# Patient Record
Sex: Female | Born: 1980
Health system: Southern US, Community
[De-identification: ages and names within clinical notes are randomized; demographics above are authoritative.]

## PROBLEM LIST (undated history)

## (undated) DIAGNOSIS — L309 Dermatitis, unspecified: Secondary | ICD-10-CM

## (undated) DIAGNOSIS — O24419 Gestational diabetes mellitus in pregnancy, unspecified control: Secondary | ICD-10-CM

## (undated) DIAGNOSIS — R809 Proteinuria, unspecified: Secondary | ICD-10-CM

## (undated) DIAGNOSIS — I10 Essential (primary) hypertension: Secondary | ICD-10-CM

## (undated) DIAGNOSIS — Z8632 Personal history of gestational diabetes: Secondary | ICD-10-CM

## (undated) HISTORY — DX: Dermatitis, unspecified: L30.9

## (undated) HISTORY — DX: Gestational diabetes mellitus in pregnancy, unspecified control: O24.419

## (undated) HISTORY — DX: Essential (primary) hypertension: I10

## (undated) HISTORY — PX: NO PAST SURGERIES: SHX2092

## (undated) HISTORY — DX: Proteinuria, unspecified: R80.9

## (undated) HISTORY — DX: Personal history of gestational diabetes: Z86.32

---

## 2019-09-12 NOTE — L&D Delivery Note (Addendum)
Delivery Note At 1:51 AM, on April 18, 2020, a viable female "Quran" was delivered via Vaginal, Spontaneous (Presentation: Right Occiput Anterior) by Bradley Ferris, SNM.  APGAR: 7, 10; weight 8 lb (3629 g).   Placenta status: Spontaneous, Intact.  Cord: 3 vessels with the following complications: None.   Anesthesia: Epidural Episiotomy: None Lacerations: None Suture Repair: None Est. Blood Loss (mL): 60  Mom to postpartum.  Baby to Couplet care / Skin to Skin.  Cherre Robins, MSN, CNM

## 2019-10-14 ENCOUNTER — Ambulatory Visit (INDEPENDENT_AMBULATORY_CARE_PROVIDER_SITE_OTHER): Payer: Self-pay | Admitting: *Deleted

## 2019-10-14 ENCOUNTER — Encounter: Payer: Self-pay | Admitting: *Deleted

## 2019-10-14 ENCOUNTER — Other Ambulatory Visit: Payer: Self-pay

## 2019-10-14 DIAGNOSIS — O09529 Supervision of elderly multigravida, unspecified trimester: Secondary | ICD-10-CM

## 2019-10-14 DIAGNOSIS — O0990 Supervision of high risk pregnancy, unspecified, unspecified trimester: Secondary | ICD-10-CM

## 2019-10-14 DIAGNOSIS — Z8632 Personal history of gestational diabetes: Secondary | ICD-10-CM

## 2019-10-14 DIAGNOSIS — R809 Proteinuria, unspecified: Secondary | ICD-10-CM | POA: Insufficient documentation

## 2019-10-14 DIAGNOSIS — Z8759 Personal history of other complications of pregnancy, childbirth and the puerperium: Secondary | ICD-10-CM

## 2019-10-14 NOTE — Progress Notes (Signed)
Patient seen and assessed by nursing staff during this encounter. I have reviewed the chart and agree with the documentation and plan.  Jaynie Collins, MD 10/14/2019 11:09 AM

## 2019-10-14 NOTE — Progress Notes (Signed)
I connected with  Wendy Harris on 10/14/19 at 10:30 AM EST by telephone and verified that I am speaking with the correct person using two identifiers.   I discussed the limitations, risks, security and privacy concerns of performing an evaluation and management service by telephone and the availability of in person appointments. I also discussed with the patient that there may be a patient responsible charge related to this service. The patient expressed understanding and agreed to proceed.   Explained I am completing her New OB Intake today. We discussed Her EDD and that it is based on  Early [place]. I reviewed her allergies, meds, OB History, Medical /Surgical history, and appropriate screenings.I informed her of [facility] services.  I explained I will send her the Bab[employer] app- app sent to her while on phone and she will download after call.  I explained we will send a blood pressure cuff to [facility] once her medicaid is active in [place]- she has applied to change from [place] to [place]. Explained  then we will have her take her blood pressure weekly and enter into the app. Explained she will have some visits in office and some virtually. I sent her a text to sign up for  MyChart and she will complete after the call. I reviewed her new ob  appointment date/ time with her , our location and to wear mask, no visitors.  I explained she will have a pelvic exam, ob bloodwork, hemoglobin a1C, cbg , genetic testing if desired,- she does want a panorama,  pap if needed. I scheduled an Korea at 19 weeks and gave her the appointment. I also offered her genetic counseling due to her age and she accepted. I gave her that appointment also.  She voices understanding.   Lisa-Marie Rueger,RN 10/14/2019  10:18 AM

## 2019-10-14 NOTE — Patient Instructions (Signed)

## 2019-10-23 ENCOUNTER — Other Ambulatory Visit: Payer: Self-pay

## 2019-10-23 ENCOUNTER — Ambulatory Visit (INDEPENDENT_AMBULATORY_CARE_PROVIDER_SITE_OTHER): Payer: Self-pay | Admitting: Obstetrics & Gynecology

## 2019-10-23 VITALS — BP 114/77 | HR 97 | Wt 217.0 lb

## 2019-10-23 DIAGNOSIS — O0990 Supervision of high risk pregnancy, unspecified, unspecified trimester: Secondary | ICD-10-CM

## 2019-10-23 DIAGNOSIS — Z113 Encounter for screening for infections with a predominantly sexual mode of transmission: Secondary | ICD-10-CM

## 2019-10-23 LAB — POCT URINALYSIS DIP (DEVICE)
Bilirubin Urine: NEGATIVE
Glucose, UA: NEGATIVE mg/dL
Hgb urine dipstick: NEGATIVE
Ketones, ur: NEGATIVE mg/dL
Leukocytes,Ua: NEGATIVE
Nitrite: NEGATIVE
Protein, ur: NEGATIVE mg/dL
Specific Gravity, Urine: >=1.03 (ref 1.005–1.030)
Urobilinogen, UA: 0.2 mg/dL (ref 0.0–1.0)
pH: 6.5 (ref 5.0–8.0)

## 2019-10-23 NOTE — Progress Notes (Signed)
Went to room to see patient but discovered that she left after the RN intake. She will be called and a new appointment will be offered to her.   Jaynie Collins, MD, FACOG Obstetrician & Gynecologist, [facility] for [employer], [facility]

## 2019-10-23 NOTE — Patient Instructions (Signed)
AREA PEDIATRIC/FAMILY PRACTICE PHYSICIANS  [place] ([address]) . [facility] o Chambliss, MD; Eniola, MD; Hale, MD; Hensel, MD; McDiarmid, MD; McIntyer, MD; Neal, MD; Walden, MD o [address] [place], [place] [address] o [phone] o Mon-Fri 8:30-12:30, 1:30-5:00 o Providers come to see babies at [facility] o Accepting Medicaid . [facility] o Limited providers who accept newborns: Koirala, MD; Morrow, MD; Wolters, MD o [address], [place], [place] [address] o [phone] o Mon-Fri 8:00-5:30 o Babies seen by providers at [facility] o Does NOT accept Medicaid o Please call early in hospitalization for appointment (limited availability)  . [facility] o Mulberry, MD o [address] [place], [place] [address] o [phone] o Mon, Tue, Thur, Fri 8:30-5:00, Wed 10:00-7:00 (closed 1-2pm) o Babies seen by [facility] providers o Accepting Medicaid . [facility] o Rubin, MD o [address], [place], [place] [address] o [phone] o Mon-Fri 8:30-5:00, Sat 8:30-12:00 o Provider comes to see babies at [facility] o Accepting Medicaid o Must have been referred from current patients or contacted office prior to delivery . [facility] ([facility]) o Brown, MD; Chandler, MD; Ettefagh, MD; Grant, MD; Lester, MD; McCormick, MD; McQueen, MD; Prose, MD; Simha, MD; Stanley, MD; Stryffeler, NP; Tebben, NP o [address], [place], [place] [address] o [phone] o Mon, Tue, Thur, Fri 8:30-5:30, Wed 9:30-5:30, Sat 8:30-12:30 o Babies seen by [facility] providers o Accepting Medicaid o Only accepting infants of first-time parents or siblings of current patients o Hospital discharge coordinator will make follow-up appointment . Jack Amos o [address],  [place], [place]  [address] o [phone]   Fax - [phone] . [facility] o [address], [place], [place]  [address] o Phone - [phone]   Fax - [phone] . Shilpa Gosrani o [address], [place], [place]  [address] o [phone]  [place] ([address]) . [facility] o Bates, MD; [place], MD; Cooper, Cox, MD; MD; Davis, MD; Dovico, MD; Ettefaugh, MD; Little, MD; Lowe, MD; Keiffer, MD; Melvin, MD; Sumner, MD; Williams, MD o [address], [place], [place] [address] o [phone] o Mon-Fri 8:30-5:00 (extended evenings Mon-Thur as needed), Sat-Sun 10:00-1:00 o Providers come to see babies at [facility] o Accepting Medicaid for families of first-time babies and families with all children in the household age 3 and under. Must register with office prior to making appointment (M-F only). . [facility] o Henson, NP; Knapp, MD; Lalonde, MD; Tysinger, PA o [address] [place],  [address] o [phone] o Mon-Fri 8:00-5:00 o Babies seen by providers at [facility] o Does NOT accept Medicaid/Commercial Insurance Only . [facility] ([facility]  o Artis, MD; Barnes, MD; Bratton, MD; Coccaro, MD; Lockett Gardner, MD; Kramer, MD; Marshall, MD; Netherton, MD; Poleto, MD; Skinner, MD o [address] [place],  [address] o [phone] o Mon-Fri 8:30-5:30, Sat (Oct.-Mar.) 9:00-1:00 o Babies seen by providers at [facility] o Accepting Medicaid  [place] ([address]) . [facility] o Reid, MD; Warner, MD o [address], [place],  [address] o [phone] o Mon-Fri 8:30-5:00, Sat 8:30-12:00 o Providers come to see babies at [facility] o Does NOT accept Medicaid . [facility] o Becker, PA; Hagler, MD; Scifres, PA; Sun, MD; Swayne, MD o [address],  [place], [place] [address] o [phone] o Mon-Fri 8:00-5:00 o Babies seen by providers at [facility] o Does NOT accept Medicaid o Only accepting babies of parents who are patients o Please call early in hospitalization for appointment (limited availability) . [facility] o Clark, MD; Frye, MD; Kelleher, MD; Mack, NP; Miller, MD; O'Keller, MD; Patterson, NP; Pudlo, MD; Puzio, MD; Thomas, MD; Tucker, MD; Twiselton, MD o [address], [place], [place] [address] o [phone] o Mon-Fri 8:00-5:00, Sat 9:00-12:00 o Providers come to see babies at [facility] o Does NOT accept Medicaid  [place] ([address]) . [facility] o Limited providers accepting new patients: Brake, NP; Wharton, PA o [address], [place], [place] [address] o [phone] o Mon-Fri 8:00-5:00 o Babies seen by providers at [facility] o Does NOT accept Medicaid o Only accepting babies of parents who are patients o Please call early in hospitalization for appointment (limited availability) . [facility] o Gay, MD; Quinlan, MD o [address] [place], [place] [address] o [phone] (press 1 to schedule appointment) o Mon-Fri 8:00-5:00 o Providers come to see babies at [facility] o Does NOT accept Medicaid . [facility] o Mazer, MD o [address] [place], [place] [address] o [phone] o Mon-Fri 8:30-5:00 (lunch 12:30-1:00), extended hours by appointment only Wed 5:00-6:30 o Babies seen by [facility] providers o Accepting Medicaid . [facility] o Banks, MD; Jordan, MD; Koberlein, MD o [address], [place], [place] [address] o [phone] o Mon-Fri 8:00-5:00 o Babies seen by [facility] providers o Does NOT accept Medicaid . [facility] o Parker, MD; Hunter, MD; Wallace, DO o [address] [place], [place]  [address] o [phone] o Mon-Fri 8:00-5:00 o Babies seen by [facility] providers o Does NOT accept Medicaid . [facility] o Brandon, PA; Brecken, PA; Christy, NP; Dees, MD; DeClaire, MD; DeWeese, MD; Hansen, NP; Mills, NP; Parrish, NP; Smoot, NP; Summer, MD; Vapne, MD o [address] [place], [place] [address] o [phone] o Mon-Fri 8:30-5:00, Sat 10:00-1:00 o Providers come to see babies at [facility] o Does NOT accept Medicaid o Free prenatal information session Tuesdays at 4:45pm . [facility] o Bouska, MD; Gordon, PA; Jeffery, PA; Weber, PA o [address] [place] [address] o [phone] o Mon-Fri 7:30-5:30 o Babies seen by [facility] providers . [facility] o [address], [place], [place]  [address] o [phone]   Fax - [phone]  [place] ([address] & [address]) . [facility] o Reese, MD o [address] [place], [place] [address] o [phone] o Mon-Thur 8:00-6:00 o Providers come to see babies at [facility] o Accepting Medicaid . [facility] o Anderson, NP; Badger, MD; Beal, PA; Spencer, PA o [address] [place], [place] [address] o [phone] o Mon-Thur 7:30-7:30, Fri 7:30-4:30 o Babies seen by [facility] providers o Accepting Medicaid . [facility] o Agbuya, MD; Klett, NP; Romgoolam, MD o [address], [place], [place] [address] o [phone] o Mon-Fri 8:30-5:00, Sat 8:30-12:00 o Providers come to see babies at [facility] o Accepting Medicaid o Must have "Meet & Greet" appointment at office prior to delivery . [facility] ([facility] o McCord,   MD; Wallace, MD; Wood, MD o [address], [place], [place] [address] o [phone] o Mon-Wed 8:00-6:00, Thur-Fri 8:00-5:00, Sat 9:00-12:00 o Providers come to  see babies at [facility] o Does NOT accept Medicaid o Only accepting siblings of current patients . [facility]  o [address], [place], [place]  [address] o [phone]   Fax - [phone] . [facility] o [address], [place], [place]  [address] o [phone]   Fax - [phone]  [place] ([address] & [address]) . [facility] o Cirigliano, DO; Matthews, DO o [address] [place], [place] [address] o [phone] o Mon-Fri 7:00-5:00 o Babies seen by [facility] providers o Does NOT accept Medicaid . [facility] o Briscoe, MD; Howley, PA; Moreira, PA o [address], [place], [place] [address] o [phone] o Mon-Fri 8:00-5:00 o Babies seen by [facility] providers o Accepting Medicaid . [facility] o Boyd, MD; Church, PA; Jones, NP; Osborn, PA o [address], [place], [place] [address] o [phone] o Mon-Fri 8:00-5:00 o Babies seen by providers at [facility] o Accepting Medicaid  [place] ([address]) . [facility] [place] o Wendling, DO o [address] [place], [place] [address] o [phone] o Mon-Fri 8:00-5:00 o Babies seen by [facility] providers o Does NOT accept Medicaid o Limited availability, please call early in hospitalization to schedule follow-up . [facility] o Calderon, PA; Cummings, MD; [place]illard, MD; Luck, PA; Olson, MD; VanDeven, PA o [address], [place], [place] [address] o [phone] o Mon-Fri 8:30-5:00, Sat 9:00-12:00 o Babies seen by providers at [facility] o Accepting Medicaid o Please register online then schedule online or call office o [email or web address] . [facility] ([facility] o Hunter, NP; Kumar, MD; Mraz Rogers, PA o [address], [place], [place] [address] o [phone] o Mon-Fri 8:00-5:00 o Babies seen by providers at [facility] o Accepting Medicaid . [facility] ([facility] o Wilcox, MD; Kristi Fleenor, NP; West, MD o [address], [place], [place] [address] o [phone] o Mon-Fri 8:00-5:30, Sat&Sun by appointment (phones open at 8:30) o Babies seen by [facility] providers o Accepting Medicaid o Must be a first-time baby or sibling of current patient . [facility] [place]  o [address], [place], [place]  [address] o [phone]   Fax - [phone]  [place] ([address]) . [facility] o Brown, PA; Cowen, PA; Rice, MD; Helton, PA; Spry, MD o [address] [place], [place] [address] o [phone] o Mon-Thur 8:00-7:00, Fri 8:00-5:00, Sat 8:00-12:00, Sun 9:00-12:00 o Babies seen by [facility] providers o Accepting Medicaid . [facility] [place] o Coe-Goins, MD; Marshall, MD; Pierre-Louis, MD o [address], [place], [place] [address] o [phone] o Mon-Thur 8:00-5:00 o Babies seen by providers at [facility] o Accepting Medicaid . [facility] o Bratton, MD; Coe-Goins, MD; Hayes, MD; Lewis, MD; List, MD; Lott, MD; Marshall, MD; Moran, MD; O'Neal, MD; Pierre-Louis, MD; Pitonzo, MD; Scholer, MD; Spangle, MD o [address] [place], [place]   [address] o [phone] o Mon-Fri 8:00-5:30, Sat (Oct.-Mar.) 9:00-1:00 o Babies seen by providers at [facility] o Accepting Medicaid o Must fill out new patient packet, available online at [email or web address] . [facility] ([facility] o Friddle, NP; Harris, NP; Kelly, NP; Logan, MD;  [place]vin, PA; Poth, MD; Ramadoss, MD; Stanton, NP o [address], [place], [place] [address] o [phone] o Mon-Thur 8:00-5:30, Fri 8:00-5:00 o Babies seen by providers at [facility] o Accepting Medicaid  [place] ([address]) . [facility] o Dixon, PA; [place], MD; Pickard, MD; Tapia, PA o [address], [place], [place] [address] o [phone] o Mon-Fri 8:00-5:00 o Babies seen by providers at [facility] o Accepting Medicaid   [place] ([address]) . [facility] [place] o Masneri, DO; Meyers, MD; Nelson, PA o [address], [place], [place] [address] o [phone] o Mon-Fri 8:00-5:00 o Babies seen by providers at [facility] o Does NOT accept Medicaid o Limited appointment availability, please call early in hospitalization  . [facility] [place] o Kunedd, DO; McGowen, MD o [address], [place], [place] [address] o [phone] o Mon-Fri 8:00-5:00 o Babies seen by [facility] providers o Does NOT accept Medicaid . [facility] [place] o Cameron, MD; MacDonald, MD; Michaels, PA; Nayak, MD o [address] [place] [address], [place], [place] [address] o [phone] o Mon-Fri 8:00-5:00 o After hours clinic ([address] [place], [place] [address]) [phone] Mon-Fri 5:00-8:00, Sat 12:00-6:00, Sun 10:00-4:00 o Babies seen by [facility] providers o Accepting Medicaid . [facility] [place] o [address], [place], [place]  [address] o [phone]   Fax - [phone]  [place] ([address]) . [facility] [place] o Andy, MD o [address], [place], [place] [address] o [phone] o Mon-Fri 8:00-5:00 o Babies seen by [facility] providers o Does NOT accept Medicaid . [facility] [place] ([facility] [place]) o Eksir, MD o [address], [place], [place]  [address] o [phone] o Mon-Thur 8:00-7:00, Fri 8:00-5:00, Sat 8:00-12:00 o Babies seen by providers at [facility] o Accepting Medicaid - but does not have vaccinations in office (must be received elsewhere) o Limited availability, please call early in hospitalization  [place] ([address]) . [facility]  o Charlene Flemming, MD o [address], [place] [address] o [phone]  Fax [phone]   

## 2019-10-23 NOTE — Addendum Note (Signed)
Addended by: Kathee Delton on: 10/23/2019 12:10 PM   Modules accepted: Orders

## 2019-10-24 LAB — PROTEIN / CREATININE RATIO, URINE
Creatinine, Urine: 184.4 mg/dL
Protein, Ur: 11.3 mg/dL
Protein/Creat Ratio: 61 mg/g creat (ref 0–200)

## 2019-10-24 LAB — GC/CHLAMYDIA PROBE AMP (~~LOC~~) NOT AT ARMC
Chlamydia: NEGATIVE
Comment: NEGATIVE
Comment: NORMAL
Neisseria Gonorrhea: NEGATIVE

## 2019-10-25 LAB — CULTURE, OB URINE

## 2019-10-25 LAB — URINE CULTURE, OB REFLEX

## 2019-12-15 ENCOUNTER — Ambulatory Visit (HOSPITAL_COMMUNITY): Payer: Self-pay

## 2019-12-15 ENCOUNTER — Encounter (HOSPITAL_COMMUNITY): Payer: Self-pay

## 2019-12-16 ENCOUNTER — Ambulatory Visit (HOSPITAL_COMMUNITY): Payer: Self-pay | Admitting: Obstetrics & Gynecology

## 2019-12-16 ENCOUNTER — Other Ambulatory Visit (HOSPITAL_COMMUNITY): Payer: Self-pay | Admitting: *Deleted

## 2019-12-16 ENCOUNTER — Other Ambulatory Visit: Payer: Self-pay

## 2019-12-16 ENCOUNTER — Ambulatory Visit (HOSPITAL_COMMUNITY): Payer: Medicaid Other | Admitting: *Deleted

## 2019-12-16 ENCOUNTER — Ambulatory Visit (HOSPITAL_BASED_OUTPATIENT_CLINIC_OR_DEPARTMENT_OTHER): Payer: Medicaid Other | Admitting: Genetic Counselor

## 2019-12-16 ENCOUNTER — Encounter (HOSPITAL_COMMUNITY): Payer: Self-pay | Admitting: *Deleted

## 2019-12-16 ENCOUNTER — Ambulatory Visit (HOSPITAL_COMMUNITY)
Admission: RE | Admit: 2019-12-16 | Discharge: 2019-12-16 | Disposition: A | Discharge status: Home / Self Care | Payer: Medicaid Other | Source: Ambulatory Visit | Attending: Obstetrics & Gynecology | Admitting: Obstetrics & Gynecology

## 2019-12-16 DIAGNOSIS — O09292 Supervision of pregnancy with other poor reproductive or obstetric history, second trimester: Secondary | ICD-10-CM

## 2019-12-16 DIAGNOSIS — O0990 Supervision of high risk pregnancy, unspecified, unspecified trimester: Secondary | ICD-10-CM

## 2019-12-16 DIAGNOSIS — Z315 Encounter for genetic counseling: Secondary | ICD-10-CM

## 2019-12-16 DIAGNOSIS — Z363 Encounter for antenatal screening for malformations: Secondary | ICD-10-CM

## 2019-12-16 DIAGNOSIS — Z8759 Personal history of other complications of pregnancy, childbirth and the puerperium: Secondary | ICD-10-CM

## 2019-12-16 DIAGNOSIS — O09529 Supervision of elderly multigravida, unspecified trimester: Secondary | ICD-10-CM | POA: Insufficient documentation

## 2019-12-16 DIAGNOSIS — Z3A19 19 weeks gestation of pregnancy: Secondary | ICD-10-CM

## 2019-12-16 DIAGNOSIS — Z8632 Personal history of gestational diabetes: Secondary | ICD-10-CM | POA: Insufficient documentation

## 2019-12-16 DIAGNOSIS — O09522 Supervision of elderly multigravida, second trimester: Secondary | ICD-10-CM

## 2019-12-16 DIAGNOSIS — Z362 Encounter for other antenatal screening follow-up: Secondary | ICD-10-CM

## 2019-12-16 NOTE — Progress Notes (Signed)
12/16/2019  Wendy Harris 1981-07-10 MRN: [id number] DOV: 12/16/2019  Wendy Harris presented to the [facility] for a genetics consultation regarding advanced maternal age. Wendy Harris came to her appointment alone due to COVID-19 visitor restrictions.   Indication for genetic counseling - Advanced maternal age  Prenatal history  Wendy Harris is a [id number], 39 y.o. female. Her current pregnancy has completed [redacted]w[redacted]d (Estimated Date of Delivery: 05/08/20).  Ms. Shilling denied exposure to environmental toxins or chemical agents. She denied the use of alcohol, tobacco or street drugs. She denied significant viral illnesses, fevers, and bleeding during the course of her pregnancy. She has a history of atopic dermatitis in the current pregnancy. Her medical history was otherwise noncontributory.  Family History  A three generation pedigree was drafted and reviewed. The family history is remarkable for the following:  - Wendy Harris has a sister was born with a heart murmur. The etiology of this heart murmur is unknown and records are not available. Thus, precise risk assessment is limited.  - Wendy Harris has a paternal half sister who was born with a cleft palate. This individual's daughter was also born with a cleft palate. Cleft palate is most often an isolated condition, but can be present in combination with other birth defects possibly as part of a genetic syndrome. Per Wendy Harris, these individuals do not have any other medical or developmental problems; thus, their cleft palate is likely isolated. Cleft palate occurs in 0.04% of the general population. If the cleft palate in the family is isolated, the risk of recurrence for a third degree relative such as the current fetus is ~0.5% Ladene Artist et al., 2009). Detailed anatomy ultrasounds are often able to detect cleft palate prenatally.  - Wendy Harris's partner, Wendy Harris, has a son from a prior relationship who has motor and speech  delays. Mr. Shon Baton also has a sister and a maternal half brother who have a learning disability. We discussed that many times, learning disabilities and developmental delays are multifactorial in nature, occurring due to a combination of genetic and environmental factors that are difficult to identify. Learning disabilities and developmental delays can appear to run in families; thus, there is a chance that the couple's children could also experience learning disabilities or developmental delays of some kind. Wendy Harris understands that she should make the pediatrician aware of any concerns she has about her children's development.  The remaining family histories were reviewed and found to be noncontributory for birth defects, intellectual disability, recurrent pregnancy loss, and known genetic conditions.    The patient's ethnicity is [place]. The father of the pregnancy's ethnicity is African American. Consanguinity was denied. Wendy Harris was unsure of whether or not she or her partner have any Ashkenazi Jewish ancestry. Pedigree will be scanned under Media.  Discussion  WendyMartinwas referredtogenetic counseling for advanced maternal age, as she will be25years old at the time of delivery. We discussed that as a woman ages, the chance for certain chromosomal conditions, such astrisomy 57 (Down syndrome), trisomy 10, and trisomy 23 in a fetus increases. These conditions often are not inherited, but insteadoccur due toan error in chromosomal division during the formation of sperm and eggcells in a process called nondisjunction.Nondisjunction occurs more frequently as a woman ages.AtMs. Harris'sage and during thesecondtrimester,she has approximately a 1 in 56 (2%) chance of having a child with a chromosomalaneuploidy. Her age-related risk to have a child with Down syndrome specifically is 1 in99(1%)in thesecondtrimester. Webriefly reviewed features  associated with Down syndrome, trisomy  88, and trisomy 63.  We reviewed noninvasive prenatal screening (NIPS) as an available screening option. Specifically, we discussed that NIPS analyzes cell free DNA originating from the placenta that is found in the maternal blood circulation during pregnancy. This test is not diagnostic for chromosome conditions, but can provide information regarding the presence or absence of extra fetal DNA for chromosomes 13, 18, 21, and the sex chromosomes. Thus, it would not identify or rule out all fetal aneuploidy. The reported detection rate is 91-99% for trisomies 21, 18, 13, and sex chromosome aneuploidies. The false positive rate is reported to be less than 0.1% for any of these conditions. Wendy Harris indicated that she is not interested in undergoing NIPS. She indicated that her pregnancy management would remain the same if the current fetus were suspected to have a chromosomal aneuploidy, so she felt comfortable proceeding with standard ultrasounds only.   Per ACOG recommendation, carrier screening for hemoglobinopathies, cystic fibrosis (CF) and spinal muscular atrophy (SMA) was discussed including information about the conditions, rationale for testing, autosomal recessive inheritance, and the option of prenatal diagnosis. The patient was informed that select hemoglobinopathies and CF are included on Wendy Harris's newborn screen. SMA will be added to Wendy Harris's newborn screen in April 2021. [name] declined all carrier screening today. Without carrier screening to refine risk and based on ethnicity alone, [name] risk to be a carrier of CF is 1 in 15. Her risk to be a carrier of SMA is 1 in 68. Her risk to be a carrier of HBB-related hemoglobinopathies is 1 in 8.   A complete ultrasound was performed today prior to our visit. The ultrasound report will be sent under separate cover. There were no visualized fetal anomalies or markers suggestive of aneuploidy. [name] was counseled that  approximately 50% of babies with Down syndrome and 90% of babies with trisomies 48 or 18 would demonstrate some sign of the condition on this ultrasound.  Ms. Thatch was also counseled regarding diagnostic testing via amniocentesis. We discussed the technical aspects of the procedure and quoted up to a 1 in 500 (0.2%) risk for spontaneous pregnancy loss or other adverse pregnancy outcomes as a result of amniocentesis. Cultured cells from an amniocentesis sample allow for the visualization of a fetal karyotype, which can detect >99% of chromosomal aberrations. Chromosomal microarray can also be performed to identify smaller deletions or duplications of fetal chromosomal material. After careful consideration, Ms. Lovecchio declined amniocentesis at this time. She understands that amniocentesis is available at any point after 16 weeks of pregnancy and that she may opt to undergo the procedure at a later date should she change her mind.  Lastly, screening for open neural tube defects (ONTDs) via MS-AFP in the second trimester in addition to level II ultrasound examination is recommended. Ms. Rouillard level II ultrasound did not detect any ONTDs. Level II ultrasound is able to detect ONTDs with 90-95% sensitivity. However, normal results from level II ultrasound and MS-AFP screening do not guarantee a normal baby, as 3-5% of newborns have some type of birth defect, many of which are not prenatally diagnosable.  Additional screening and diagnostic testing were declined today. She understands that screening tests, including ultrasound, cannot rule out all birth defects or genetic syndromes. The patient was advised of this limitation and states she still does not want additional testing or screening at this time.   I counseled Ms. Sherrin regarding the above risks and available  options. The approximate face-to-face time with the genetic counselor was 25 minutes.  In summary:  Discussed advanced maternal age and  options for follow-up testing  ~2% chance for chromosomal aneuploidy based on age at delivery  Declined noninvasive prenatal screening (NIPS) for chromosomal aneuploidies  Reviewed results of ultrasound  No fetal anomalies or markers seen  Reduction in risk for fetal aneuploidy  Discussed carrier screening for cystic fibrosis, spinal muscular atrophy, and hemoglobinopathies  Declined carrier screening  Offered additional testing and screening  Declined amniocentesis  Recommend MS-AFP screening  Reviewed family history concerns   Buelah Manis, MS, Counselling psychologist

## 2019-12-19 ENCOUNTER — Telehealth: Payer: Self-pay | Admitting: Physician Assistant

## 2019-12-19 DIAGNOSIS — L309 Dermatitis, unspecified: Secondary | ICD-10-CM

## 2019-12-19 MED ORDER — TRIAMCINOLONE ACETONIDE 0.1 % EX CREA: 1.0000 "application " | TOPICAL_CREAM | Freq: Two times a day (BID) | CUTANEOUS | 0 refills | Status: DC

## 2019-12-19 NOTE — Progress Notes (Signed)
E Visit for Rash  We are sorry that you are not feeling well. Here is how we plan to help!  If your condition fails to improve with the following plan, please make an appointment with your primary care provider.  Please be sure to have a plan in place agreed upon by you and your PCP for how to treat your next flare, as this is a chronic condition.   If you do not have a PCP, [facility] offers a free physician referral service available at [phone]. Our trained staff has the experience, knowledge and resources to put you in touch with a physician who is right for you.    I have prescribed Kenalog ointment. Apply this twice daily for the next 2 weeks. (If you have an infant/young children, wear gloves after application to prevent exposing your child to this medication.)  HOME CARE:  Avoid excessive hand washing.  Wash your hands with luke warm water with mild un-scented soap. Gently pat your hands dry, especially between fingers.  Hand washing and drying should be followed immediately by the application of a generous amount of heavy hand cream or ointment, such as petroleum jelly.   Avoid triggers: chemical exposure, excessive wetness.   GET HELP RIGHT AWAY IF:   Symptoms don't go away after treatment.  Severe itching that persists.  If you rash spreads or swells.  If you rash begins to smell.  If it blisters and opens or develops a yellow-brown crust.  You develop a fever.  You have a sore throat.  You become short of breath.  MAKE SURE YOU:  Understand these instructions. Will watch your condition. Will get help right away if you are not doing well or get worse.  Thank you for choosing an e-visit. Your e-visit answers were reviewed by a board certified advanced clinical practitioner to complete your personal care plan. Depending upon the condition, your plan could have included both over the counter or prescription medications. Please review your pharmacy choice. Be  sure that the pharmacy you have chosen is open so that you can pick up your prescription now.  If there is a problem you may message your provider in MyChart to have the prescription routed to another pharmacy. Your safety is important to [place]. If you have drug allergies check your prescription carefully.  For the next 24 hours, you can use MyChart to ask questions about today's visit, request a non-urgent call back, or ask for a work or school excuse from your e-visit provider. You will get an email in the next two days asking about your experience. I hope that your e-visit has been valuable and will speed your recovery.     Greater than 5 minutes, yet less than 10 minutes of time have been spent researching, coordinating and implementing care for this patient today.

## 2019-12-23 NOTE — Progress Notes (Signed)
Erroneous encounter

## 2020-01-13 ENCOUNTER — Ambulatory Visit
Admission: RE | Admit: 2020-01-13 | Discharge: 2020-01-13 | Discharge status: Absent without leave | Payer: Self-pay | Source: Ambulatory Visit

## 2020-01-13 ENCOUNTER — Other Ambulatory Visit (INDEPENDENT_AMBULATORY_CARE_PROVIDER_SITE_OTHER): Payer: Self-pay | Admitting: Physician Assistant

## 2020-01-15 ENCOUNTER — Other Ambulatory Visit: Payer: Self-pay

## 2020-01-15 ENCOUNTER — Encounter: Payer: Self-pay | Admitting: *Deleted

## 2020-01-15 ENCOUNTER — Ambulatory Visit (HOSPITAL_COMMUNITY): Payer: Medicaid Other | Attending: Obstetrics and Gynecology

## 2020-01-15 ENCOUNTER — Ambulatory Visit: Payer: Medicaid Other | Admitting: *Deleted

## 2020-01-15 DIAGNOSIS — O0990 Supervision of high risk pregnancy, unspecified, unspecified trimester: Secondary | ICD-10-CM

## 2020-01-15 DIAGNOSIS — Z8759 Personal history of other complications of pregnancy, childbirth and the puerperium: Secondary | ICD-10-CM | POA: Diagnosis present

## 2020-01-15 DIAGNOSIS — O09529 Supervision of elderly multigravida, unspecified trimester: Secondary | ICD-10-CM | POA: Insufficient documentation

## 2020-01-15 DIAGNOSIS — O09522 Supervision of elderly multigravida, second trimester: Secondary | ICD-10-CM

## 2020-01-15 DIAGNOSIS — Z362 Encounter for other antenatal screening follow-up: Secondary | ICD-10-CM | POA: Diagnosis present

## 2020-01-15 DIAGNOSIS — O09292 Supervision of pregnancy with other poor reproductive or obstetric history, second trimester: Secondary | ICD-10-CM

## 2020-01-15 DIAGNOSIS — Z3A23 23 weeks gestation of pregnancy: Secondary | ICD-10-CM

## 2020-01-27 ENCOUNTER — Other Ambulatory Visit: Payer: Self-pay

## 2020-01-27 ENCOUNTER — Encounter: Payer: Self-pay | Admitting: Obstetrics and Gynecology

## 2020-01-27 ENCOUNTER — Ambulatory Visit (INDEPENDENT_AMBULATORY_CARE_PROVIDER_SITE_OTHER): Payer: Medicaid Other | Admitting: Obstetrics and Gynecology

## 2020-01-27 VITALS — BP 137/69 | HR 100 | Wt 248.2 lb

## 2020-01-27 DIAGNOSIS — Z8632 Personal history of gestational diabetes: Secondary | ICD-10-CM

## 2020-01-27 DIAGNOSIS — Z8759 Personal history of other complications of pregnancy, childbirth and the puerperium: Secondary | ICD-10-CM

## 2020-01-27 DIAGNOSIS — O0990 Supervision of high risk pregnancy, unspecified, unspecified trimester: Secondary | ICD-10-CM

## 2020-01-27 DIAGNOSIS — O09522 Supervision of elderly multigravida, second trimester: Secondary | ICD-10-CM

## 2020-01-27 DIAGNOSIS — O2603 Excessive weight gain in pregnancy, third trimester: Secondary | ICD-10-CM

## 2020-01-27 DIAGNOSIS — O09292 Supervision of pregnancy with other poor reproductive or obstetric history, second trimester: Secondary | ICD-10-CM

## 2020-01-27 DIAGNOSIS — O2600 Excessive weight gain in pregnancy, unspecified trimester: Secondary | ICD-10-CM | POA: Insufficient documentation

## 2020-01-27 DIAGNOSIS — Z3A25 25 weeks gestation of pregnancy: Secondary | ICD-10-CM

## 2020-01-27 DIAGNOSIS — O09529 Supervision of elderly multigravida, unspecified trimester: Secondary | ICD-10-CM

## 2020-01-27 DIAGNOSIS — O2602 Excessive weight gain in pregnancy, second trimester: Secondary | ICD-10-CM

## 2020-01-27 NOTE — Progress Notes (Signed)
   PRENATAL VISIT NOTE  Subjective:  Wendy Harris is a 39 y.o. [id number] at [redacted]w[redacted]d being seen today for ongoing prenatal care.  She is currently monitored for the following issues for this high-risk pregnancy and has History of gestational diabetes mellitus (GDM); History of gestational hypertension; Supervision of high risk pregnancy, antepartum; AMA (advanced maternal age) multigravida 35+; Proteinuria; and Excessive weight gain during pregnancy on their problem list.  Patient reports no complaints.  Contractions: Not present. Vag. Bleeding: None.  Movement: Present. Denies leaking of fluid.   The following portions of the patient's history were reviewed and updated as appropriate: allergies, current medications, past family history, past medical history, past social history, past surgical history and problem list.   Objective:   Vitals:   01/27/20 0908  BP: 137/69  Pulse: 100  Weight: 248 lb 3.2 oz (112.6 kg)    Fetal Status: Fetal Heart Rate (bpm): 145   Movement: Present     General:  Alert, oriented and cooperative. Patient is in no acute distress.  Skin: Skin is warm and dry. No rash noted.   Cardiovascular: Normal heart rate noted  Respiratory: Normal respiratory effort, no problems with respiration noted  Abdomen: Soft, gravid, appropriate for gestational age.  Pain/Pressure: Absent     Pelvic: Cervical exam deferred        Extremities: Normal range of motion.  Edema: None  Mental Status: Normal mood and affect. Normal behavior. Normal judgment and thought content.   Assessment and Plan:  Pregnancy: [id number] at [redacted]w[redacted]d  1. Supervision of high risk pregnancy, antepartum Needs pap smear pp Declines genetic screening Reviewed [facility] [employer] practice structure, multiple providers, fellows, medical students, virtual visits, MyChart.  Patient has not had COVID vaccine. Reviewed risks/benefits in pregnancy/breastfeeding and gave info. She declines.  2. History of  gestational hypertension Not on baby ASA  3. History of gestational diabetes mellitus (GDM) Diet controlled  4. Antepartum multigravida of advanced maternal age  41. Excessive weight gain during pregnancy in third trimester Eats a lot of carbs bc she is allergic to a lot of fruits Gained 90 pounds with first pregnancy, 80 pounds with 2nd pregnancy Declines referral to nutrition  Preterm labor symptoms and general obstetric precautions including but not limited to vaginal bleeding, contractions, leaking of fluid and fetal movement were reviewed in detail with the patient. Please refer to After Visit Summary for other counseling recommendations.   Return in about 2 weeks (around 02/10/2020) for high OB, in person, 2 hr GTT, Tdap, 3rd trim labs.  No future appointments.  Conan Bowens, MD

## 2020-01-28 LAB — OBSTETRIC PANEL, INCLUDING HIV
Antibody Screen: NEGATIVE
Basophils Absolute: 0 10*3/uL (ref 0.0–0.2)
Basos: 1 %
EOS (ABSOLUTE): 0.2 10*3/uL (ref 0.0–0.4)
Eos: 2 %
HIV Screen 4th Generation wRfx: NONREACTIVE
Hematocrit: 30.3 % — ABNORMAL LOW (ref 34.0–46.6)
Hemoglobin: 10.6 g/dL — ABNORMAL LOW (ref 11.1–15.9)
Hepatitis B Surface Ag: NEGATIVE
Immature Grans (Abs): 0.1 10*3/uL (ref 0.0–0.1)
Immature Granulocytes: 1 %
Lymphocytes Absolute: 1.5 10*3/uL (ref 0.7–3.1)
Lymphs: 18 %
MCH: 32.3 pg (ref 26.6–33.0)
MCHC: 35 g/dL (ref 31.5–35.7)
MCV: 92 fL (ref 79–97)
Monocytes Absolute: 0.6 10*3/uL (ref 0.1–0.9)
Monocytes: 8 %
Neutrophils Absolute: 5.6 10*3/uL (ref 1.4–7.0)
Neutrophils: 70 %
Platelets: 420 10*3/uL (ref 150–450)
RBC: 3.28 x10E6/uL — ABNORMAL LOW (ref 3.77–5.28)
RDW: 12.1 % (ref 11.7–15.4)
RPR Ser Ql: NONREACTIVE
Rh Factor: POSITIVE
Rubella Antibodies, IGG: 1.56 index (ref >0.99)
WBC: 7.9 10*3/uL (ref 3.4–10.8)

## 2020-01-28 LAB — HEPATITIS C ANTIBODY: Hep C Virus Ab: <0.1 s/co ratio (ref 0.0–0.9)

## 2020-02-11 ENCOUNTER — Other Ambulatory Visit: Payer: Self-pay | Admitting: *Deleted

## 2020-02-11 ENCOUNTER — Ambulatory Visit (INDEPENDENT_AMBULATORY_CARE_PROVIDER_SITE_OTHER): Payer: Medicaid Other | Admitting: Obstetrics & Gynecology

## 2020-02-11 ENCOUNTER — Other Ambulatory Visit: Payer: Medicaid Other

## 2020-02-11 ENCOUNTER — Other Ambulatory Visit: Payer: Self-pay

## 2020-02-11 ENCOUNTER — Encounter: Payer: Self-pay | Admitting: Obstetrics & Gynecology

## 2020-02-11 VITALS — BP 129/78 | HR 109 | Wt 248.0 lb

## 2020-02-11 DIAGNOSIS — Z3A27 27 weeks gestation of pregnancy: Secondary | ICD-10-CM

## 2020-02-11 DIAGNOSIS — O0990 Supervision of high risk pregnancy, unspecified, unspecified trimester: Secondary | ICD-10-CM

## 2020-02-11 DIAGNOSIS — O09529 Supervision of elderly multigravida, unspecified trimester: Secondary | ICD-10-CM

## 2020-02-11 DIAGNOSIS — O0992 Supervision of high risk pregnancy, unspecified, second trimester: Secondary | ICD-10-CM

## 2020-02-11 NOTE — Patient Instructions (Addendum)
Glucose Tolerance Test During Pregnancy Why am I having this test? The glucose tolerance test (GTT) is done to check how your body processes sugar (glucose). This is one of several tests used to diagnose diabetes that develops during pregnancy (gestational diabetes mellitus). Gestational diabetes is a temporary form of diabetes that some women develop during pregnancy. It usually occurs during the second trimester of pregnancy and goes away after delivery. Testing (screening) for gestational diabetes usually occurs between 24 and 28 weeks of pregnancy. You may have the GTT test after having a 1-hour glucose screening test if the results from that test indicate that you may have gestational diabetes. You may also have this test if:  You have a history of gestational diabetes.  You have a history of giving birth to very large babies or have experienced repeated fetal loss (stillbirth).  You have signs and symptoms of diabetes, such as: ? Changes in your vision. ? Tingling or numbness in your hands or feet. ? Changes in hunger, thirst, and urination that are not otherwise explained by your pregnancy. What is being tested? This test measures the amount of glucose in your blood at different times during a period of 3 hours. This indicates how well your body is able to process glucose. What kind of sample is taken?  Blood samples are required for this test. They are usually collected by inserting a needle into a blood vessel. How do I prepare for this test?  For 3 days before your test, eat normally. Have plenty of carbohydrate-rich foods.  Follow instructions from your health care provider about: ? Eating or drinking restrictions on the day of the test. You may be asked to not eat or drink anything other than water (fast) starting 8-10 hours before the test. ? Changing or stopping your regular medicines. Some medicines may interfere with this test. Tell a health care provider about:  All  medicines you are taking, including vitamins, herbs, eye drops, creams, and over-the-counter medicines.  Any blood disorders you have.  Any surgeries you have had.  Any medical conditions you have. What happens during the test? First, your blood glucose will be measured. This is referred to as your fasting blood glucose, since you fasted before the test. Then, you will drink a glucose solution that contains a certain amount of glucose. Your blood glucose will be measured again 1, 2, and 3 hours after drinking the solution. This test takes about 3 hours to complete. You will need to stay at the testing location during this time. During the testing period:  Do not eat or drink anything other than the glucose solution.  Do not exercise.  Do not use any products that contain nicotine or tobacco, such as cigarettes and e-cigarettes. If you need help stopping, ask your health care provider. The testing procedure may vary among health care providers and hospitals. How are the results reported? Your results will be reported as milligrams of glucose per deciliter of blood (mg/dL) or millimoles per liter (mmol/L). Your health care provider will compare your results to normal ranges that were established after testing a large group of people (reference ranges). Reference ranges may vary among labs and hospitals. For this test, common reference ranges are:  Fasting: less than 95-105 mg/dL (5.3-5.8 mmol/L).  1 hour after drinking glucose: less than 180-190 mg/dL (10.0-10.5 mmol/L).  2 hours after drinking glucose: less than 155-165 mg/dL (8.6-9.2 mmol/L).  3 hours after drinking glucose: 140-145 mg/dL (7.8-8.1 mmol/L). What do the   results mean? Results within reference ranges are considered normal, meaning that your glucose levels are well-controlled. If two or more of your blood glucose levels are high, you may be diagnosed with gestational diabetes. If only one level is high, your health care  provider may suggest repeat testing or other tests to confirm a diagnosis. Talk with your health care provider about what your results mean. Questions to ask your health care provider Ask your health care provider, or the department that is doing the test:  When will my results be ready?  How will I get my results?  What are my treatment options?  What other tests do I need?  What are my next steps? Summary  The glucose tolerance test (GTT) is one of several tests used to diagnose diabetes that develops during pregnancy (gestational diabetes mellitus). Gestational diabetes is a temporary form of diabetes that some women develop during pregnancy.  You may have the GTT test after having a 1-hour glucose screening test if the results from that test indicate that you may have gestational diabetes. You may also have this test if you have any symptoms or risk factors for gestational diabetes.  Talk with your health care provider about what your results mean. This information is not intended to replace advice given to you by your health care provider. Make sure you discuss any questions you have with your health care provider. Document Revised: 12/19/2018 Document Reviewed: 04/09/2017 Elsevier Patient Education  2020 [employer].   Gestational Diabetes Mellitus, Diagnosis Gestational diabetes (gestational diabetes mellitus) is a temporary form of diabetes that some women develop during pregnancy. It usually occurs around weeks 24-28 of pregnancy, and it goes away after delivery. Hormonal changes during pregnancy can interfere with insulin production and function, which may result in one or both of these problems:  The pancreas does not make enough of a hormone called insulin.  Cells in the body do not respond properly to insulin that the body makes (insulin resistance). Normally, insulin allows blood sugar (glucose) to enter cells in the body. The cells use glucose for energy. Insulin  resistance or lack of insulin causes excess glucose to build up in the blood instead of going into cells. As a result, high blood glucose (hyperglycemia) develops. If gestational diabetes is treated, it is not likely to cause problems. If it is not controlled with treatment, it may cause problems during labor and delivery, and some of those problems can be harmful to the unborn baby (fetus) and the mother. Women who get gestational diabetes are more likely to develop it if they get pregnant again, and they are more likely to develop type 2 diabetes in the future. What increases the risk? This condition may be more likely to develop in pregnant women who:  Are older than age 61 during pregnancy.  Have a family history of diabetes.  Are overweight.  Had gestational diabetes in the past.  Have polycystic ovary syndrome (PCOS).  Are pregnant with twins or multiples.  Are of American-Indian, African-American, Hispanic/Latino, or Asian/Pacific Islander descent. What are the signs or symptoms? Most women do not notice symptoms of gestational diabetes because the symptoms are similar to normal symptoms of pregnancy. Symptoms of gestational diabetes may include:  Increased thirst (polydipsia).  Increased hunger(polyphagia).  Increased urination (polyuria). How is this diagnosed? This condition may be diagnosed based on your blood glucose level, which may be checked with one or more of the following blood tests:  A fasting blood glucose (  FBG) test. You will not be allowed to eat (you will fast) for 8 hours or longer before a blood sample is taken.  A random blood glucose test. This checks your blood glucose at any time of day regardless of when you ate.  An oral glucose tolerance test (OGTT). This is usually done during weeks 24-28 of pregnancy. ? For this test, you will have an FBG test done. Then, you will drink a beverage that contains glucose. Your blood glucose will be tested again one  hour after you drink the glucose beverage (1-hour OGTT). ? If the 1-hour OGTT result is at or above 140 mg/dL (7.8 mmol/L), you will repeat the OGTT. This time, your blood glucose will be tested 3 hours after you drink the glucose beverage (3-hour OGTT). If you have risk factors, you may be screened for undiagnosed type 2 diabetes at your first health care visit during your pregnancy (prenatal visit). How is this treated?     Your treatment may be managed by a specialist called an endocrinologist. This condition is treated by following instructions from your health care provider about:  Eating a healthy diet and getting more physical activity. These changes are the most important ways to manage gestational diabetes.  Checking your blood glucose. Do this as often as told.  Taking diabetes medicines or insulin every day. These will only be prescribed if they are needed. ? If you use insulin, you may need to adjust your dosage based on how physically active you are and what foods you eat. Your health care provider will tell you how to do this. Your health care provider will set treatment goals for you based on the stage of your pregnancy and any other medical conditions you have. Generally, the goal of treatment is to maintain the following blood glucose levels during pregnancy:  Before meals (preprandial): at or below 95 mg/dL (5.3 mmol/L).  After meals (postprandial): ? One hour after a meal: at or below 140 mg/dL (7.8 mmol/L). ? Two hours after a meal: at or below 120 mg/dL (6.7 mmol/L).  A1c (hemoglobin A1c) level: 6-6.5%. Follow these instructions at home: Questions to ask your health care provider  Consider asking the following questions: ? Do I need to meet with a diabetes educator? ? What equipment will I need to manage my diabetes at home? ? What diabetes medicines do I need, and when should I take them? ? How often do I need to check my blood glucose? ? What number can I call  if I have questions? ? When is my next appointment? General instructions  Take over-the-counter and prescription medicines only as told by your health care provider.  Manage your weight gain during pregnancy. The amount of weight that you are expected to gain depends on your pre-pregnancy BMI (body mass index).  Keep all follow-up visits as told by your health care provider. This is important.  For more information about diabetes, visit: ? American Diabetes Association (ADA): www.diabetes.org ? American Association of Diabetes Educators (AADE): www.diabeteseducator.org Contact a health care provider if:  Your blood glucose level is at or above 240 mg/dL (13.3 mmol/L).  Your blood glucose level is at or above 200 mg/dL (11.1 mmol/L) and you have ketones in your urine.  You have been sick or have had a fever for 2 days or longer and you are not getting better.  You have any of the following problems for more than 6 hours: ? You cannot eat or drink. ?  You have nausea and vomiting. ? You have diarrhea. Get help right away if:  Your blood glucose is lower than 54 mg/dL (3 mmol/L).  You become confused or you have trouble thinking clearly.  You have difficulty breathing.  You have moderate or large ketone levels in your urine.  Your baby is moving around less than usual.  You develop unusual discharge or bleeding from your vagina.  You start having contractions early (prematurely). Contractions may feel like a tightening in your lower abdomen. Summary  Gestational diabetes (gestational diabetes mellitus) is a temporary form of diabetes that some women develop during pregnancy. It usually occurs around weeks 24-28 of pregnancy, and it goes away after delivery.  This condition is treated by making diet and lifestyle changes and taking diabetes medicines or insulin, if needed.  Women who get gestational diabetes are more likely to develop it if they get pregnant again, and they  are more likely to develop type 2 diabetes in the future. This information is not intended to replace advice given to you by your health care provider. Make sure you discuss any questions you have with your health care provider. Document Revised: 10/04/2017 Document Reviewed: 10/01/2015 Elsevier Patient Education  2020 [employer].

## 2020-02-11 NOTE — Progress Notes (Signed)
Subjective:    Wendy Harris is a 39 y.o. [id number] [redacted]w[redacted]d being seen today for her obstetrical visit.  Patient reports no complaints. Fetal movement: normal.  Objective:    BP 129/78   Pulse (!) 109   Wt 112.5 kg   LMP  (LMP Unknown)   BMI 37.71 kg/m   Physical Exam  Vitals reviewed. Constitutional: She is oriented to person, place, and time. She appears well-developed and well-nourished.  HENT:  Head: Normocephalic and atraumatic.  Eyes: Pupils are equal, round, and reactive to light.  Cardiovascular: Normal rate.  Respiratory: Effort normal.  GI: Soft. She exhibits no distension. There is no abdominal tenderness. There is no guarding.  Musculoskeletal:        General: No edema.     Cervical back: Normal range of motion.  Neurological: She is alert and oriented to person, place, and time.  Skin: Skin is warm and dry.  Psychiatric: She has a normal mood and affect. Her behavior is normal.    Exam  FHT: Fetal Heart Rate (bpm): 146  Uterine Size:  28wks     Assessment:    Pregnancy:  [id number] at 27.4wks here for prenatal visit. 2hr Glucola today. Discussed weight gain this pregnancy currently 20#s this pregnancy.     Plan:    Patient Active Problem List   Diagnosis Date Noted  . Excessive weight gain during pregnancy 01/27/2020  . Supervision of high risk pregnancy, antepartum 10/14/2019  . AMA (advanced maternal age) multigravida 35+ 10/14/2019  . History of gestational diabetes mellitus (GDM)   . History of gestational hypertension   . Proteinuria    Follow up in 3 weeks.

## 2020-02-12 LAB — GLUCOSE TOLERANCE, 2 HOURS W/ 1HR
Glucose, 1 hour: 225 mg/dL — ABNORMAL HIGH (ref 65–179)
Glucose, 2 hour: 168 mg/dL — ABNORMAL HIGH (ref 65–152)
Glucose, Fasting: 83 mg/dL (ref 65–91)

## 2020-02-13 ENCOUNTER — Other Ambulatory Visit: Payer: Self-pay | Admitting: Physician Assistant

## 2020-02-13 ENCOUNTER — Telehealth: Payer: Self-pay | Admitting: Lactation Services

## 2020-02-13 MED ORDER — ACCU-CHEK GUIDE W/DEVICE KIT: 1.0000 | PACK | Freq: Once | 0 refills | Status: AC

## 2020-02-13 MED ORDER — ACCU-CHEK SOFTCLIX LANCETS MISC
12 refills | Status: DC
Start: 2020-02-13 — End: 2020-04-19

## 2020-02-13 MED ORDER — ACCU-CHEK GUIDE VI STRP: ORAL_STRIP | 12 refills | Status: DC

## 2020-02-13 NOTE — Telephone Encounter (Signed)
Called patient to let her know she is + GDM. Patient reports she has had in the past.   Informed patient we would like for her to attend Diabetes Education and she will be checking her blood sugars 4 x a day. Supplies ordered and patient to pick up and bring to appt. Not to front desk to schedule patient for DE.   Patient reports she has no questions or concerns at this time.

## 2020-02-13 NOTE — Telephone Encounter (Signed)
-----   Message from Malachy Chamber, MD sent at 02/12/2020  2:50 PM EDT ----- Elevated Glucose 1hr and 2hr, will need workup for gestational diabetes.

## 2020-02-19 ENCOUNTER — Encounter: Payer: Medicaid Other | Attending: Obstetrics & Gynecology | Admitting: Registered"

## 2020-02-19 DIAGNOSIS — E669 Obesity, unspecified: Secondary | ICD-10-CM | POA: Diagnosis not present

## 2020-02-19 DIAGNOSIS — O99213 Obesity complicating pregnancy, third trimester: Secondary | ICD-10-CM | POA: Insufficient documentation

## 2020-02-19 DIAGNOSIS — O24419 Gestational diabetes mellitus in pregnancy, unspecified control: Secondary | ICD-10-CM | POA: Insufficient documentation

## 2020-02-19 DIAGNOSIS — Z3A30 30 weeks gestation of pregnancy: Secondary | ICD-10-CM | POA: Diagnosis not present

## 2020-02-24 ENCOUNTER — Other Ambulatory Visit: Payer: Self-pay

## 2020-02-24 ENCOUNTER — Ambulatory Visit: Payer: Medicaid Other | Admitting: Registered"

## 2020-02-24 DIAGNOSIS — O26893 Other specified pregnancy related conditions, third trimester: Secondary | ICD-10-CM

## 2020-02-24 NOTE — Progress Notes (Signed)
Patient was seen on 02/24/20 for Gestational Diabetes self-management. EDD 05/08/20. Patient states prior history of GDM but because it was diagnosed late in pregnancy, patient did not check blood sugar or treat. Diet history obtained. Patient eats variety of all food groups. Beverages include water.  Patient is likely not consuming excess carbohydrates.  Patient states physical activity is very difficult due to pelvic pain. RD entered referral for PT.  The following learning objectives were met by the patient :   States the definition of Gestational Diabetes  States why dietary management is important in controlling blood glucose  Describes the effects of carbohydrates on blood glucose levels  Demonstrates ability to create a balanced meal plan  Demonstrates carbohydrate counting   States when to check blood glucose levels  Demonstrates proper blood glucose monitoring techniques  States the effect of stress and exercise on blood glucose levels  States the importance of limiting caffeine and abstaining from alcohol and smoking  Plan:  Aim for 3 Carbohydrate Choices per meal (45 grams) +/- 1 either way  Aim for 1-2 Carbohydrate Choices per snack Begin reading food labels for Total Carbohydrate of foods If OK with your MD, consider  increasing your activity level by walking, Arm Chair Exercises or other activity daily as tolerated Begin checking Blood Glucose before breakfast and 2 hours after first bite of breakfast, lunch and dinner as directed by MD  Bring Log Book/Sheet and meter to every medical appointment  Baby Scripts: Patient was introduced to [employer] but states she prefers to record on glucose log sheet Take medication if directed by MD  Patient already has a meter and is testing pre breakfast and 2 hours after each meal. Review of Log Book shows: readings are within or close to target range. With some tweaks to diet and stress management, patient may be able to control  with diet and lifestyle.  Patient instructed to monitor glucose levels: FBS: 60 - 95 mg/dl 2 hour: <120 mg/dl  Patient received the following handouts:  Nutrition Diabetes and Pregnancy  Carbohydrate Counting List  Blood glucose Log Sheet  Patient will be seen for follow-up in as needed.

## 2020-02-27 ENCOUNTER — Other Ambulatory Visit: Payer: Self-pay | Admitting: *Deleted

## 2020-02-27 ENCOUNTER — Other Ambulatory Visit: Payer: Self-pay

## 2020-02-27 DIAGNOSIS — O99210 Obesity complicating pregnancy, unspecified trimester: Secondary | ICD-10-CM

## 2020-02-27 DIAGNOSIS — O24419 Gestational diabetes mellitus in pregnancy, unspecified control: Secondary | ICD-10-CM

## 2020-03-03 ENCOUNTER — Other Ambulatory Visit: Payer: Self-pay

## 2020-03-03 ENCOUNTER — Ambulatory Visit (INDEPENDENT_AMBULATORY_CARE_PROVIDER_SITE_OTHER): Payer: Medicaid Other | Admitting: Family Medicine

## 2020-03-03 VITALS — BP 128/79 | HR 104 | Wt 249.7 lb

## 2020-03-03 DIAGNOSIS — Z3A30 30 weeks gestation of pregnancy: Secondary | ICD-10-CM

## 2020-03-03 DIAGNOSIS — O0990 Supervision of high risk pregnancy, unspecified, unspecified trimester: Secondary | ICD-10-CM

## 2020-03-03 DIAGNOSIS — O09529 Supervision of elderly multigravida, unspecified trimester: Secondary | ICD-10-CM

## 2020-03-03 DIAGNOSIS — O24415 Gestational diabetes mellitus in pregnancy, controlled by oral hypoglycemic drugs: Secondary | ICD-10-CM

## 2020-03-03 DIAGNOSIS — O09513 Supervision of elderly primigravida, third trimester: Secondary | ICD-10-CM

## 2020-03-03 DIAGNOSIS — O09523 Supervision of elderly multigravida, third trimester: Secondary | ICD-10-CM

## 2020-03-03 DIAGNOSIS — Z23 Encounter for immunization: Secondary | ICD-10-CM

## 2020-03-03 DIAGNOSIS — O24419 Gestational diabetes mellitus in pregnancy, unspecified control: Secondary | ICD-10-CM

## 2020-03-03 DIAGNOSIS — O0993 Supervision of high risk pregnancy, unspecified, third trimester: Secondary | ICD-10-CM

## 2020-03-03 LAB — POCT URINALYSIS DIP (DEVICE)
Bilirubin Urine: NEGATIVE
Glucose, UA: NEGATIVE mg/dL
Hgb urine dipstick: NEGATIVE
Leukocytes,Ua: NEGATIVE
Nitrite: NEGATIVE
Protein, ur: 30 mg/dL — AB
Specific Gravity, Urine: >=1.03 (ref 1.005–1.030)
Urobilinogen, UA: 0.2 mg/dL (ref 0.0–1.0)
pH: 6 (ref 5.0–8.0)

## 2020-03-03 MED ORDER — TRIAMCINOLONE ACETONIDE 0.1 % EX CREA: 1.0000 "application " | TOPICAL_CREAM | Freq: Two times a day (BID) | CUTANEOUS | 3 refills | Status: DC

## 2020-03-03 MED ORDER — METFORMIN HCL 500 MG PO TABS: 500.0000 mg | ORAL_TABLET | Freq: Two times a day (BID) | ORAL | 5 refills | Status: DC

## 2020-03-03 NOTE — Progress Notes (Signed)
Subjective:  Wendy Harris is a 39 y.o. E3P2951 at [redacted]w[redacted]d being seen today for ongoing prenatal care.  She is currently monitored for the following issues for this high-risk pregnancy and has History of gestational diabetes mellitus (GDM); History of gestational hypertension; Supervision of high risk pregnancy, antepartum; AMA (advanced maternal age) multigravida 35+; Proteinuria; and Excessive weight gain during pregnancy on their problem list.  GDM: Patient diet controlled.  Reports no hypoglycemic episodes.  Tolerating medication well Fasting: 85-105, half over 95 2hr PP: 150-170  Patient reports no complaints.  Contractions: Irritability. Vag. Bleeding: None.  Movement: Present. Denies leaking of fluid.   The following portions of the patient's history were reviewed and updated as appropriate: allergies, current medications, past family history, past medical history, past social history, past surgical history and problem list. Problem list updated.  Objective:   Vitals:   03/03/20 1031  BP: 128/79  Pulse: (!) 104  Weight: 249 lb 11.2 oz (113.3 kg)    Fetal Status: Fetal Heart Rate (bpm): 154   Movement: Present     General:  Alert, oriented and cooperative. Patient is in no acute distress.  Skin: Skin is warm and dry. No rash noted.   Cardiovascular: Normal heart rate noted  Respiratory: Normal respiratory effort, no problems with respiration noted  Abdomen: Soft, gravid, appropriate for gestational age. Pain/Pressure: Present     Pelvic: Vag. Bleeding: None     Cervical exam deferred        Extremities: Normal range of motion.  Edema: None  Mental Status: Normal mood and affect. Normal behavior. Normal judgment and thought content.   Urinalysis:      Assessment and Plan:  Pregnancy: [id number] at [redacted]w[redacted]d  1. Supervision of high risk pregnancy, antepartum FHT normal  2. Gestational diabetes mellitus (GDM), antepartum, gestational diabetes method of control unspecified Start  metformin Antenatal testing  3. Elderly multigravida with antepartum condition or complication ASA 81mg   Preterm labor symptoms and general obstetric precautions including but not limited to vaginal bleeding, contractions, leaking of fluid and fetal movement were reviewed in detail with the patient. Please refer to After Visit Summary for other counseling recommendations.  No follow-ups on file.   , DO

## 2020-03-03 NOTE — Addendum Note (Signed)
Addended by: Maxwell Marion E on: 03/03/2020 01:12 PM   Modules accepted: Orders

## 2020-03-17 ENCOUNTER — Ambulatory Visit (INDEPENDENT_AMBULATORY_CARE_PROVIDER_SITE_OTHER): Payer: Medicaid Other

## 2020-03-17 ENCOUNTER — Other Ambulatory Visit: Payer: Self-pay

## 2020-03-17 ENCOUNTER — Ambulatory Visit: Payer: Medicaid Other | Admitting: *Deleted

## 2020-03-17 ENCOUNTER — Ambulatory Visit (INDEPENDENT_AMBULATORY_CARE_PROVIDER_SITE_OTHER): Payer: Medicaid Other | Admitting: Obstetrics & Gynecology

## 2020-03-17 VITALS — BP 125/71 | HR 98 | Wt 250.2 lb

## 2020-03-17 DIAGNOSIS — Z3A32 32 weeks gestation of pregnancy: Secondary | ICD-10-CM

## 2020-03-17 DIAGNOSIS — O0990 Supervision of high risk pregnancy, unspecified, unspecified trimester: Secondary | ICD-10-CM

## 2020-03-17 DIAGNOSIS — O24415 Gestational diabetes mellitus in pregnancy, controlled by oral hypoglycemic drugs: Secondary | ICD-10-CM | POA: Diagnosis not present

## 2020-03-17 DIAGNOSIS — O0993 Supervision of high risk pregnancy, unspecified, third trimester: Secondary | ICD-10-CM

## 2020-03-17 NOTE — Patient Instructions (Signed)
Return to office for any scheduled appointments. Call the office or go to the MAU at [facility] [place] if:  You begin to have strong, frequent contractions  Your water breaks.  Sometimes it is a big gush of fluid, sometimes it is just a trickle that keeps getting your panties wet or running down your legs  You have vaginal bleeding.  It is normal to have a small amount of spotting if your cervix was checked.   You do not feel your baby moving like normal.  If you do not, get something to eat and drink and lay down and focus on feeling your baby move.   If your baby is still not moving like normal, you should call the office or go to MAU.  Any other obstetric concerns.   

## 2020-03-17 NOTE — Progress Notes (Signed)
   PRENATAL VISIT NOTE  Subjective:  Wendy Harris is a 39 y.o. [id number] at [redacted]w[redacted]d being seen today for ongoing prenatal care.  She is currently monitored for the following issues for this high-risk pregnancy and has History of gestational diabetes mellitus (GDM); History of gestational hypertension; Supervision of high risk pregnancy, antepartum; AMA (advanced maternal age) multigravida 35+; Proteinuria; Excessive weight gain during pregnancy; and Gestational diabetes mellitus (GDM) in third trimester controlled on oral hypoglycemic drug on their problem list.  Patient reports no complaints.  Contractions: Irritability. Vag. Bleeding: None.  Movement: Present. Denies leaking of fluid.   The following portions of the patient's history were reviewed and updated as appropriate: allergies, current medications, past family history, past medical history, past social history, past surgical history and problem list.   Objective:   Vitals:   03/17/20 1041  BP: 125/71  Pulse: 98  Weight: 250 lb 3.2 oz (113.5 kg)    Fetal Status: Fetal Heart Rate (bpm): RNST   Movement: Present     General:  Alert, oriented and cooperative. Patient is in no acute distress.  Skin: Skin is warm and dry. No rash noted.   Cardiovascular: Normal heart rate noted  Respiratory: Normal respiratory effort, no problems with respiration noted  Abdomen: Soft, gravid, appropriate for gestational age.  Pain/Pressure: Present     Pelvic: Cervical exam deferred        Extremities: Normal range of motion.  Edema: None  Mental Status: Normal mood and affect. Normal behavior. Normal judgment and thought content.   Assessment and Plan:  Pregnancy: [id number] at [redacted]w[redacted]d 1. Gestational diabetes mellitus (GDM) in third trimester controlled on oral hypoglycemic drug Sugars are better controlled on Metformin 500 mg po bid, fasting in 90s, PP 120-130s mostly.  NST performed today was reviewed and was found to be reactive. Subsequent BPP  performed today was also reviewed and was found to be 10/10. AFI was also normal. Continue recommended antenatal testing and prenatal care. Growth scan as recommended.  2. [redacted] weeks gestation of pregnancy 3. Supervision of high risk pregnancy, antepartum Preterm labor symptoms and general obstetric precautions including but not limited to vaginal bleeding, contractions, leaking of fluid and fetal movement were reviewed in detail with the patient. Please refer to After Visit Summary for other counseling recommendations.   Return in about 1 week (around 03/24/2020) for weekly NST/BPP and HOB.  No future appointments.  Jaynie Collins, MD

## 2020-03-24 ENCOUNTER — Other Ambulatory Visit: Payer: Medicaid Other

## 2020-03-25 ENCOUNTER — Ambulatory Visit (INDEPENDENT_AMBULATORY_CARE_PROVIDER_SITE_OTHER): Payer: Medicaid Other | Admitting: Obstetrics and Gynecology

## 2020-03-25 ENCOUNTER — Ambulatory Visit: Payer: Self-pay

## 2020-03-25 ENCOUNTER — Other Ambulatory Visit: Payer: Self-pay

## 2020-03-25 ENCOUNTER — Ambulatory Visit (INDEPENDENT_AMBULATORY_CARE_PROVIDER_SITE_OTHER): Payer: Medicaid Other | Admitting: *Deleted

## 2020-03-25 VITALS — BP 130/71 | HR 114 | Wt 253.8 lb

## 2020-03-25 DIAGNOSIS — O0990 Supervision of high risk pregnancy, unspecified, unspecified trimester: Secondary | ICD-10-CM

## 2020-03-25 DIAGNOSIS — O1213 Gestational proteinuria, third trimester: Secondary | ICD-10-CM

## 2020-03-25 DIAGNOSIS — O0993 Supervision of high risk pregnancy, unspecified, third trimester: Secondary | ICD-10-CM

## 2020-03-25 DIAGNOSIS — Z8759 Personal history of other complications of pregnancy, childbirth and the puerperium: Secondary | ICD-10-CM

## 2020-03-25 DIAGNOSIS — O2603 Excessive weight gain in pregnancy, third trimester: Secondary | ICD-10-CM

## 2020-03-25 DIAGNOSIS — Z8632 Personal history of gestational diabetes: Secondary | ICD-10-CM

## 2020-03-25 DIAGNOSIS — O24415 Gestational diabetes mellitus in pregnancy, controlled by oral hypoglycemic drugs: Secondary | ICD-10-CM | POA: Diagnosis not present

## 2020-03-25 DIAGNOSIS — Z3A33 33 weeks gestation of pregnancy: Secondary | ICD-10-CM

## 2020-03-25 DIAGNOSIS — O09523 Supervision of elderly multigravida, third trimester: Secondary | ICD-10-CM

## 2020-03-25 NOTE — Progress Notes (Signed)
   PRENATAL VISIT NOTE  Subjective:  Wendy Harris is a 39 y.o. [id number] at [redacted]w[redacted]d being seen today for ongoing prenatal care.  She is currently monitored for the following issues for this high-risk pregnancy and has History of gestational diabetes mellitus (GDM); History of gestational hypertension; Supervision of high risk pregnancy, antepartum; AMA (advanced maternal age) multigravida 35+; Proteinuria; Excessive weight gain during pregnancy; and Gestational diabetes mellitus (GDM) in third trimester controlled on oral hypoglycemic drug on their problem list.  Patient doing well with no acute concerns today. She reports no complaints.  Contractions: Not present. Vag. Bleeding: None.  Movement: Present. Denies leaking of fluid.   The following portions of the patient's history were reviewed and updated as appropriate: allergies, current medications, past family history, past medical history, past social history, past surgical history and problem list. Problem list updated.  Objective:   Vitals:   03/25/20 1516  BP: 130/71  Pulse: (!) 114  Weight: 253 lb 12.8 oz (115.1 kg)   BPP/NST 10/10 FHT baseline 135, NST reactive, no decels Fetal Status: Fetal Heart Rate (bpm): NST   Movement: Present     General:  Alert, oriented and cooperative. Patient is in no acute distress.  Skin: Skin is warm and dry. No rash noted.   Cardiovascular: Normal heart rate noted  Respiratory: Normal respiratory effort, no problems with respiration noted  Abdomen: Soft, gravid, appropriate for gestational age.  Pain/Pressure: Present     Pelvic: Cervical exam deferred        Extremities: Normal range of motion.  Edema: None  Mental Status:  Normal mood and affect. Normal behavior. Normal judgment and thought content.   Assessment and Plan:  Pregnancy: [id number] at [redacted]w[redacted]d  1. Supervision of high risk pregnancy, antepartum Weekly BPP/NST  2. Gestational diabetes mellitus (GDM) in third trimester controlled on oral  hypoglycemic drug Growth scan at 36 weeks FBS: 59-98 PPBS: 90-141  90% of values were within range Continue metformin 3. History of gestational hypertension BP WNL   4. Multigravida of advanced maternal age in third trimester   5. Excessive weight gain during pregnancy in third trimester   Preterm labor symptoms and general obstetric precautions including but not limited to vaginal bleeding, contractions, leaking of fluid and fetal movement were reviewed in detail with the patient.  Please refer to After Visit Summary for other counseling recommendations.   Return for weekly as scheduled, [facility], in person.   Mariel Aloe, MD

## 2020-03-25 NOTE — Patient Instructions (Signed)

## 2020-03-31 ENCOUNTER — Ambulatory Visit (INDEPENDENT_AMBULATORY_CARE_PROVIDER_SITE_OTHER): Payer: Medicaid Other | Admitting: Obstetrics & Gynecology

## 2020-03-31 ENCOUNTER — Ambulatory Visit (INDEPENDENT_AMBULATORY_CARE_PROVIDER_SITE_OTHER): Payer: Medicaid Other | Admitting: *Deleted

## 2020-03-31 ENCOUNTER — Ambulatory Visit: Payer: Self-pay

## 2020-03-31 ENCOUNTER — Other Ambulatory Visit: Payer: Self-pay

## 2020-03-31 VITALS — BP 131/84 | HR 116 | Wt 253.5 lb

## 2020-03-31 DIAGNOSIS — L2089 Other atopic dermatitis: Secondary | ICD-10-CM

## 2020-03-31 DIAGNOSIS — O0993 Supervision of high risk pregnancy, unspecified, third trimester: Secondary | ICD-10-CM

## 2020-03-31 DIAGNOSIS — R519 Headache, unspecified: Secondary | ICD-10-CM

## 2020-03-31 DIAGNOSIS — O24415 Gestational diabetes mellitus in pregnancy, controlled by oral hypoglycemic drugs: Secondary | ICD-10-CM

## 2020-03-31 DIAGNOSIS — O09523 Supervision of elderly multigravida, third trimester: Secondary | ICD-10-CM

## 2020-03-31 DIAGNOSIS — Z8759 Personal history of other complications of pregnancy, childbirth and the puerperium: Secondary | ICD-10-CM

## 2020-03-31 DIAGNOSIS — Z3A34 34 weeks gestation of pregnancy: Secondary | ICD-10-CM

## 2020-03-31 DIAGNOSIS — O0990 Supervision of high risk pregnancy, unspecified, unspecified trimester: Secondary | ICD-10-CM

## 2020-03-31 DIAGNOSIS — O26893 Other specified pregnancy related conditions, third trimester: Secondary | ICD-10-CM

## 2020-03-31 DIAGNOSIS — O99713 Diseases of the skin and subcutaneous tissue complicating pregnancy, third trimester: Secondary | ICD-10-CM

## 2020-03-31 MED ORDER — BUTALBITAL-APAP-CAFFEINE 50-325-40 MG PO CAPS: 1.0000 | ORAL_CAPSULE | Freq: Four times a day (QID) | ORAL | 3 refills | Status: DC | PRN

## 2020-03-31 MED ORDER — TRIAMCINOLONE ACETONIDE 0.5 % EX OINT: 1.0000 "application " | TOPICAL_OINTMENT | Freq: Two times a day (BID) | CUTANEOUS | 5 refills | Status: DC

## 2020-03-31 MED ORDER — COMPLETENATE 29-1 MG PO CHEW: 1.0000 | CHEWABLE_TABLET | Freq: Every day | ORAL | 3 refills | Status: DC

## 2020-03-31 NOTE — Patient Instructions (Signed)
Return to office for any scheduled appointments. Call the office or go to the MAU at [facility] if:  You begin to have strong, frequent contractions  Your water breaks.  Sometimes it is a big gush of fluid, sometimes it is just a trickle that keeps getting your panties wet or running down your legs  You have vaginal bleeding.  It is normal to have a small amount of spotting if your cervix was checked.   You do not feel your baby moving like normal.  If you do not, get something to eat and drink and lay down and focus on feeling your baby move.   If your baby is still not moving like normal, you should call the office or go to MAU.  Any other obstetric concerns.   

## 2020-03-31 NOTE — Progress Notes (Signed)
PRENATAL VISIT NOTE  Subjective:  Wendy Harris is a 39 y.o. [id number] at [redacted]w[redacted]d being seen today for ongoing prenatal care.  She is currently monitored for the following issues for this high-risk pregnancy and has History of gestational diabetes mellitus (GDM); History of gestational hypertension; Supervision of high risk pregnancy, antepartum; AMA (advanced maternal age) multigravida 35+; Proteinuria; Excessive weight gain during pregnancy; and Gestational diabetes mellitus (GDM) in third trimester controlled on oral hypoglycemic drug on their problem list.  Patient reports headache that occur occasionally. Not alleviated with Tylenol.  Currently has one that is 8/10.  No visual changes, no RUQ pain.  Contractions: Irregular. Vag. Bleeding: None.  Movement: Present. Denies leaking of fluid.   The following portions of the patient's history were reviewed and updated as appropriate: allergies, current medications, past family history, past medical history, past social history, past surgical history and problem list.   Objective:   Vitals:   03/31/20 0929  BP: 131/84  Pulse: (!) 116  Weight: 253 lb 8 oz (115 kg)    Fetal Status: Fetal Heart Rate (bpm): NST   Movement: Present     General:  Alert, oriented and cooperative. Patient is in no acute distress.  Skin: Skin is warm and dry. No rash noted.   Cardiovascular: Normal heart rate noted  Respiratory: Normal respiratory effort, no problems with respiration noted  Abdomen: Soft, gravid, appropriate for gestational age.  Pain/Pressure: Present     Pelvic: Cervical exam deferred        Extremities: Normal range of motion.  Edema: None  Mental Status: Normal mood and affect. Normal behavior. Normal judgment and thought content.   Assessment and Plan:  Pregnancy: [id number] at [redacted]w[redacted]d 1. Gestational diabetes mellitus (GDM) in third trimester controlled on oral hypoglycemic drug Blood sugars within expected range as per report, did not bring log.  Emphasized importance of bringing log. Continue Metformin and weekly antenatal testing. Reassuring BPP today.  2. Multigravida of advanced maternal age in third trimester No issues.  3. Headache in pregnancy, antepartum, third trimester H/O of GHTN, BP in 130s/80s today. Will check labs.  Advised to take Fioricet, keep hydrated. Severe PEC precautions given. - Butalbital-APAP-Caffeine 50-325-40 MG capsule; Take 1-2 capsules by mouth every 6 (six) hours as needed for headache.  Dispense: 30 capsule; Refill: 3 - CBC - Comprehensive metabolic panel - Protein / creatinine ratio, urine  4. Atopic dermatitis Desired stronger ointment in lien of cream, this was prescribed.  - triamcinolone ointment (KENALOG) 0.5 %; Apply 1 application topically 2 (two) times daily.  Dispense: 30 g; Refill: 5  5. Supervision of high risk pregnancy, antepartum Preterm labor symptoms and general obstetric precautions including but not limited to vaginal bleeding, contractions, leaking of fluid and fetal movement were reviewed in detail with the patient. Please refer to After Visit Summary for other counseling recommendations.   Return in about 1 week (around 04/07/2020) for weekly as scheduled.  Future Appointments  Date Time Provider Department Center  03/31/2020 10:55 AM [facility] US1 [facility]  04/07/2020  8:15 AM [facility] NST [facility]  04/07/2020 10:15 AM Warden Fillers, MD [facility]  04/14/2020  9:15 AM [facility] NST [facility]  04/14/2020 10:15 AM Hermina Staggers, MD [facility]  04/21/2020  9:15 AM [facility] NST [facility]  04/21/2020 10:15 AM [place] Bing, MD [facility]  04/28/2020  9:15 AM [facility] NST [facility]  04/28/2020 10:15 AM Sadorus Bing, MD [facility]  05/05/2020  9:15  AM [facility] NST [facility]  05/05/2020 10:15 AM Reva Bores, MD [facility]  05/12/2020  9:15 AM [facility] NST [facility]  05/12/2020 10:15 AM Kimiyah Blick, Jethro Bastos, MD [facility]    Jaynie Collins, MD

## 2020-03-31 NOTE — Progress Notes (Signed)
Pt reports having occasional H/A and dizziness. She requests a change in Rx of Kenalog cream - she would like ointment instead.  Increased GAD-7 score discussed w/pt. She declines [facility] appt at this time.

## 2020-04-01 LAB — CBC
Hematocrit: 31.9 % — ABNORMAL LOW (ref 34.0–46.6)
Hemoglobin: 10.7 g/dL — ABNORMAL LOW (ref 11.1–15.9)
MCH: 29.6 pg (ref 26.6–33.0)
MCHC: 33.5 g/dL (ref 31.5–35.7)
MCV: 88 fL (ref 79–97)
Platelets: 374 10*3/uL (ref 150–450)
RBC: 3.61 x10E6/uL — ABNORMAL LOW (ref 3.77–5.28)
RDW: 12.4 % (ref 11.7–15.4)
WBC: 8.1 10*3/uL (ref 3.4–10.8)

## 2020-04-01 LAB — COMPREHENSIVE METABOLIC PANEL
ALT: 10 IU/L (ref 0–32)
AST: 11 IU/L (ref 0–40)
Albumin/Globulin Ratio: 1.3 (ref 1.2–2.2)
Albumin: 3.5 g/dL — ABNORMAL LOW (ref 3.8–4.8)
Alkaline Phosphatase: 131 IU/L — ABNORMAL HIGH (ref 48–121)
BUN/Creatinine Ratio: 11 (ref 9–23)
BUN: 6 mg/dL (ref 6–20)
Bilirubin Total: 0.3 mg/dL (ref 0.0–1.2)
CO2: 19 mmol/L — ABNORMAL LOW (ref 20–29)
Calcium: 9.2 mg/dL (ref 8.7–10.2)
Chloride: 103 mmol/L (ref 96–106)
Creatinine, Ser: 0.56 mg/dL — ABNORMAL LOW (ref 0.57–1.00)
GFR calc Af Amer: 136 mL/min/{1.73_m2} (ref >59)
GFR calc non Af Amer: 118 mL/min/{1.73_m2} (ref >59)
Globulin, Total: 2.7 g/dL (ref 1.5–4.5)
Glucose: 129 mg/dL — ABNORMAL HIGH (ref 65–99)
Potassium: 4.3 mmol/L (ref 3.5–5.2)
Sodium: 135 mmol/L (ref 134–144)
Total Protein: 6.2 g/dL (ref 6.0–8.5)

## 2020-04-01 LAB — PROTEIN / CREATININE RATIO, URINE
Creatinine, Urine: 266.2 mg/dL
Protein, Ur: 49.8 mg/dL
Protein/Creat Ratio: 187 mg/g creat (ref 0–200)

## 2020-04-07 ENCOUNTER — Ambulatory Visit (INDEPENDENT_AMBULATORY_CARE_PROVIDER_SITE_OTHER): Payer: Medicaid Other | Admitting: Obstetrics and Gynecology

## 2020-04-07 ENCOUNTER — Ambulatory Visit: Payer: Self-pay

## 2020-04-07 ENCOUNTER — Ambulatory Visit (INDEPENDENT_AMBULATORY_CARE_PROVIDER_SITE_OTHER): Payer: Medicaid Other | Admitting: *Deleted

## 2020-04-07 ENCOUNTER — Other Ambulatory Visit: Payer: Self-pay

## 2020-04-07 VITALS — BP 130/79 | HR 115 | Wt 253.2 lb

## 2020-04-07 DIAGNOSIS — O24415 Gestational diabetes mellitus in pregnancy, controlled by oral hypoglycemic drugs: Secondary | ICD-10-CM | POA: Diagnosis not present

## 2020-04-07 DIAGNOSIS — Z3A35 35 weeks gestation of pregnancy: Secondary | ICD-10-CM

## 2020-04-07 DIAGNOSIS — O0990 Supervision of high risk pregnancy, unspecified, unspecified trimester: Secondary | ICD-10-CM

## 2020-04-07 DIAGNOSIS — Z8759 Personal history of other complications of pregnancy, childbirth and the puerperium: Secondary | ICD-10-CM

## 2020-04-07 DIAGNOSIS — O09523 Supervision of elderly multigravida, third trimester: Secondary | ICD-10-CM

## 2020-04-07 DIAGNOSIS — O2603 Excessive weight gain in pregnancy, third trimester: Secondary | ICD-10-CM

## 2020-04-07 NOTE — Progress Notes (Signed)
Pt reports decreased fetal movement x4 days. She also is having more frequent UC's. Pt c/o tingling in arms and Rt leg which is bothersome. [place] for growth scheduled on 8/4.

## 2020-04-07 NOTE — Progress Notes (Signed)
   PRENATAL VISIT NOTE  Subjective:  Wendy Harris is a 39 y.o. [id number] at [redacted]w[redacted]d being seen today for ongoing prenatal care.  She is currently monitored for the following issues for this high-risk pregnancy and has History of gestational diabetes mellitus (GDM); History of gestational hypertension; Supervision of high risk pregnancy, antepartum; AMA (advanced maternal age) multigravida 35+; Proteinuria; Excessive weight gain during pregnancy; and Gestational diabetes mellitus (GDM) in third trimester controlled on oral hypoglycemic drug on their problem list.  Patient has some pregnancy concerns today. She reports backache and vaginal swelling and difficulty walking.  Contractions: Irregular. Vag. Bleeding: None.  Movement: (!) Decreased. Denies leaking of fluid.   Discussed blood sugars with patient.  She did not bring in her blood sugars.  She described "some days good and some days not so good."  She is frustrated with her diabetes in general and would prefer a more holistic approach; however, she says she is too busy right now to really focus on diet and exercise.  Pt declines consult with dietician/diabetic specialist.    The following portions of the patient's history were reviewed and updated as appropriate: allergies, current medications, past family history, past medical history, past social history, past surgical history and problem list. Problem list updated.  Objective:   Vitals:   04/07/20 0836  BP: (!) 130/79  Pulse: (!) 115  Weight: (!) 253 lb 3.2 oz (114.9 kg)    Fetal Status: Fetal Heart Rate (bpm): NST   Movement: (!) Decreased     General:  Alert, oriented and cooperative. Patient is in no acute distress.  Skin: Skin is warm and dry. No rash noted.   Cardiovascular: Normal heart rate noted  Respiratory: Normal respiratory effort, no problems with respiration noted  Abdomen: Soft, gravid, appropriate for gestational age.  Pain/Pressure: Present     Pelvic: Cervical exam  deferred        Extremities: Normal range of motion.  Edema: None  Mental Status:  Normal mood and affect. Normal behavior. Normal judgment and thought content.   Assessment and Plan:  Pregnancy: [id number] at [redacted]w[redacted]d  1. Supervision of high risk pregnancy, antepartum 36 week labs next visit Pt desires to be delivered sooner than her due date.  At this time 39 weeks looks to be the earliest; however, if she has poor blood sugar control once she starts recording her values, 38 weeks may need to be considered  2. Gestational diabetes mellitus (GDM) in third trimester controlled on oral hypoglycemic drug Pt implored to bring in blood sugars so medication change/modification can be instituted.  Unsure with compliance at this time Growth scan scheduled for 04/14/2020   - [place] MFM FETAL BPP WO NON STRESS; Future  3. Multigravida of advanced maternal age in third trimester  - [place] MFM FETAL BPP WO NON STRESS; Future  4. History of gestational hypertension BP currently WNL  5. Excessive weight gain during pregnancy in third trimester Weight currently stable  Preterm labor symptoms and general obstetric precautions including but not limited to vaginal bleeding, contractions, leaking of fluid and fetal movement were reviewed in detail with the patient.  Please refer to After Visit Summary for other counseling recommendations.   Return in about 1 week (around 04/14/2020) for [facility], in person.   Mariel Aloe, MD

## 2020-04-07 NOTE — Patient Instructions (Signed)
Gestational Diabetes Mellitus, Self Care When you have gestational diabetes (gestational diabetes mellitus), you must make sure your blood sugar (glucose) stays in a healthy range. You can do this with:  Nutrition.  Exercise.  Lifestyle changes.  Medicines or insulin, if needed.  Support from your doctors and others. If you get treated for this condition, it may not hurt you or your unborn baby (fetus). If you do not get treated for this condition, it may cause problems that can hurt you or your unborn baby. If you get gestational diabetes, you are:  More likely to get it if you get pregnant again.  More likely to develop type 2 diabetes in the future. How to stay aware of blood sugar   Check your blood sugar every day while you are pregnant. Check it as often as told.  Call your doctor if your blood sugar is above your goal numbers for two tests in a row. Your doctor will set personal treatment goals for you. Generally, you should have these blood sugar levels:  Before meals, or after not eating for a long time (fasting or preprandial): at or below 95 mg/dL (5.3 mmol/L).  After meals (postprandial): ? One hour after a meal: at or below 140 mg/dL (7.8 mmol/L). ? Two hours after a meal: at or below 120 mg/dL (6.7 mmol/L).  A1c (hemoglobin A1c) level: 6-6.5%. How to manage high and low blood sugar Signs of high blood sugar High blood sugar is called hyperglycemia. Know the early signs of high blood sugar. Signs may include:  Feeling: ? Thirsty. ? Hungry. ? Very tired.  Needing to pee (urinate) more than usual.  Blurry vision. Signs of low blood sugar Low blood sugar is called hypoglycemia. This is when blood sugar is at or below 70 mg/dL (3.9 mmol/L). Signs may include:  Feeling: ? Hungry. ? Worried or nervous (anxious). ? Sweaty and clammy. ? Confused. ? Dizzy. ? Sleepy. ? Sick to your stomach (nauseous).  Having: ? A fast heartbeat. ? A headache. ? A change  in your vision. ? Tingling or no feeling (numbness) around your mouth, lips, or tongue. ? Jerky movements that you cannot control (seizure).  Having trouble with: ? Moving (coordination). ? Sleeping. ? Passing out (fainting). ? Getting upset easily (irritability). Treating low blood sugar To treat low blood sugar, eat or drink something sugary right away. If you can think clearly and swallow safely, follow the 15:15 rule:  Take 15 grams of a fast-acting carb (carbohydrate). Talk with your doctor about how much you should take.  Some fast-acting carbs are: ? Sugar tablets (glucose pills). Take 3-4 glucose pills. ? 6-8 pieces of hard candy. ? 4-6 oz (120-150 mL) of fruit juice. ? 4-6 oz (120-150 mL) of regular (not diet) soda. ? 1 Tbsp (15 mL) honey or sugar.  Check your blood sugar 15 minutes after you take the carb.  If your blood sugar is still at or below 70 mg/dL (3.9 mmol/L), take 15 grams of a carb again.  If your blood sugar does not go above 70 mg/dL (3.9 mmol/L) after 3 tries, get help right away.  After your blood sugar goes back to normal, eat a meal or a snack within 1 hour. Treating very low blood sugar If your blood sugar is at or below 54 mg/dL (3 mmol/L), you have very low blood sugar (severe hypoglycemia). This is an emergency. Do not wait to see if the symptoms will go away. Get medical help right  away. Call your local emergency services (911 in the [place] If you have very low blood sugar and you cannot eat or drink, you may need a glucagon shot (injection). A family member or friend should learn how to check your blood sugar and how to give you a glucagon shot. Ask your doctor if you need to have a glucagon shot kit at home. Follow these instructions at home: Medicine  Take your insulin and diabetes medicines as told.  If your doctor says you should take more or less insulin or medicines, do this exactly as told.  Do not run out of insulin or  medicines. Food   Make healthy food choices. These include: ? Chicken, fish, egg whites, and beans. ? Oats, whole wheat, bulgur, brown rice, quinoa, and millet. ? Fresh fruits and vegetables. ? Low-fat dairy products. ? Nuts, avocado, olive oil, and canola oil.  Meet with a food specialist (dietitian). He or she can help you make an eating plan that is right for you.  Follow instructions from your doctor about what you cannot eat or drink.  Drink enough fluid to keep your pee (urine) pale yellow.  Eat healthy snacks between healthy meals.  Keep track of carbs that you eat. Do this by reading food labels and learning food serving sizes.  Follow your sick day plan when you cannot eat or drink normally. Make this plan with your doctor so it is ready to use. Activity  Exercise for 30 or more minutes a day, or as much as your doctor recommends.  Talk with your doctor before you start a new exercise or activity. Your doctor may need to tell you to change: ? How much insulin or medicines you take. ? How much food you eat. Lifestyle  Do not drink alcohol.  Do not use any tobacco products. These include cigarettes, chewing tobacco, and e-cigarettes. If you need help quitting, ask your doctor.  Learn how to deal with stress. If you need help with this, ask your doctor. Body care  Stay up to date with your shots (immunizations).  Brush your teeth and gums two times a day. Floss one or more times a day.  Go to the dentist one or more times every 6 months.  Stay at a healthy weight while you are pregnant. General instructions  Take over-the-counter and prescription medicines only as told by your doctor.  Ask your doctor about risks of high blood pressure in pregnancy (preeclampsia and eclampsia).  Share your diabetes care plan with: ? Your work or school. ? People you live with.  Check your pee for ketones: ? When you are sick. ? As told by your doctor.  Carry a card or  wear jewelry that says you have diabetes.  Keep all follow-up visits as told by your doctor. This is important. Care after giving birth  Have your blood sugar checked 4-12 weeks after you give birth.  Get checked for diabetes one or more times during 3 years. Questions to ask your doctor  Do I need to meet with a diabetes educator?  Where can I find a support group for people with gestational diabetes? Where to find more information To learn more about diabetes, visit:  American Diabetes Association: www.diabetes.org  Centers for Disease Control and Prevention (CDC): www.cdc.gov Summary  Check your blood sugar (glucose) every day while you are pregnant. Check it as often as told.  Take your insulin and diabetes medicines as told.  Keep all follow-up visits as   told by your doctor. This is important.  Have your blood sugar checked 4-12 weeks after you give birth. This information is not intended to replace advice given to you by your health care provider. Make sure you discuss any questions you have with your health care provider. Document Revised: 02/18/2018 Document Reviewed: 10/01/2015 Elsevier Patient Education  [place] of Pregnancy  The third trimester is from week 28 through week 40 (months 7 through 9). This trimester is when your unborn baby (fetus) is growing very fast. At the end of the ninth month, the unborn baby is about 20 inches in length. It weighs about 6-10 pounds. Follow these instructions at home: Medicines  Take over-the-counter and prescription medicines only as told by your doctor. Some medicines are safe and some medicines are not safe during pregnancy.  Take a prenatal vitamin that contains at least 600 micrograms (mcg) of folic acid.  If you have trouble pooping (constipation), take medicine that will make your stool soft (stool softener) if your doctor approves. Eating and drinking   Eat regular, healthy meals.  Avoid  raw meat and uncooked cheese.  If you get low calcium from the food you eat, talk to your doctor about taking a daily calcium supplement.  Eat four or five small meals rather than three large meals a day.  Avoid foods that are high in fat and sugars, such as fried and sweet foods.  To prevent constipation: ? Eat foods that are high in fiber, like fresh fruits and vegetables, whole grains, and beans. ? Drink enough fluids to keep your pee (urine) clear or pale yellow. Activity  Exercise only as told by your doctor. Stop exercising if you start to have cramps.  Avoid heavy lifting, wear low heels, and sit up straight.  Do not exercise if it is too hot, too humid, or if you are in a place of great height (high altitude).  You may continue to have sex unless your doctor tells you not to. Relieving pain and discomfort  Wear a good support bra if your breasts are tender.  Take frequent breaks and rest with your legs raised if you have leg cramps or low back pain.  Take warm water baths (sitz baths) to soothe pain or discomfort caused by hemorrhoids. Use hemorrhoid cream if your doctor approves.  If you develop puffy, bulging veins (varicose veins) in your legs: ? Wear support hose or compression stockings as told by your doctor. ? Raise (elevate) your feet for 15 minutes, 3-4 times a day. ? Limit salt in your food. Safety  Wear your seat belt when driving.  Make a list of emergency phone numbers, including numbers for family, friends, the hospital, and police and fire departments. Preparing for your baby's arrival To prepare for the arrival of your baby:  Take prenatal classes.  Practice driving to the hospital.  Visit the hospital and tour the maternity area.  Talk to your work about taking leave once the baby comes.  Pack your hospital bag.  Prepare the baby's room.  Go to your doctor visits.  Buy a rear-facing car seat. Learn how to install it in your car. General  instructions  Do not use hot tubs, steam rooms, or saunas.  Do not use any products that contain nicotine or tobacco, such as cigarettes and e-cigarettes. If you need help quitting, ask your doctor.  Do not drink alcohol.  Do not douche or use tampons or scented sanitary pads.  Do not cross your legs for long periods of time.  Do not travel for long distances unless you must. Only do so if your doctor says it is okay.  Visit your dentist if you have not gone during your pregnancy. Use a soft toothbrush to brush your teeth. Be gentle when you floss.  Avoid cat litter boxes and soil used by cats. These carry germs that can cause birth defects in the baby and can cause a loss of your baby (miscarriage) or stillbirth.  Keep all your prenatal visits as told by your doctor. This is important. Contact a doctor if:  You are not sure if you are in labor or if your water has broken.  You are dizzy.  You have mild cramps or pressure in your lower belly.  You have a nagging pain in your belly area.  You continue to feel sick to your stomach, you throw up, or you have watery poop.  You have bad smelling fluid coming from your vagina.  You have pain when you pee. Get help right away if:  You have a fever.  You are leaking fluid from your vagina.  You are spotting or bleeding from your vagina.  You have severe belly cramps or pain.  You lose or gain weight quickly.  You have trouble catching your breath and have chest pain.  You notice sudden or extreme puffiness (swelling) of your face, hands, ankles, feet, or legs.  You have not felt the baby move in over an hour.  You have severe headaches that do not go away with medicine.  You have trouble seeing.  You are leaking, or you are having a gush of fluid, from your vagina before you are 37 weeks.  You have regular belly spasms (contractions) before you are 37 weeks. Summary  The third trimester is from week 28 through week 40 (months  7 through 9). This time is when your unborn baby is growing very fast.  Follow your doctor's advice about medicine, food, and activity.  Get ready for the arrival of your baby by taking prenatal classes, getting all the baby items ready, preparing the baby's room, and visiting your doctor to be checked.  Get help right away if you are bleeding from your vagina, or you have chest pain and trouble catching your breath, or if you have not felt your baby move in over an hour. This information is not intended to replace advice given to you by your health care provider. Make sure you discuss any questions you have with your health care provider. Document Revised: 12/19/2018 Document Reviewed: 10/03/2016 Elsevier Patient Education  2020 Elsevier Inc.  

## 2020-04-09 ENCOUNTER — Other Ambulatory Visit (INDEPENDENT_AMBULATORY_CARE_PROVIDER_SITE_OTHER): Payer: Medicaid Other | Admitting: Obstetrics and Gynecology

## 2020-04-09 ENCOUNTER — Encounter: Payer: Self-pay | Admitting: *Deleted

## 2020-04-09 DIAGNOSIS — O24415 Gestational diabetes mellitus in pregnancy, controlled by oral hypoglycemic drugs: Secondary | ICD-10-CM

## 2020-04-09 MED ORDER — METFORMIN HCL 500 MG PO TABS: 1000.0000 mg | ORAL_TABLET | Freq: Two times a day (BID) | ORAL | 5 refills | Status: DC

## 2020-04-09 NOTE — Progress Notes (Signed)
Fasting blood sugars are 109-121, will increase metformin to 1000 mg po BID, if ineffective, pt will need to transition to insulin

## 2020-04-14 ENCOUNTER — Other Ambulatory Visit (HOSPITAL_COMMUNITY)
Admission: RE | Admit: 2020-04-14 | Discharge: 2020-04-14 | Disposition: A | Discharge status: Home / Self Care | Payer: Medicaid Other | Source: Ambulatory Visit | Attending: Obstetrics and Gynecology | Admitting: Obstetrics and Gynecology

## 2020-04-14 ENCOUNTER — Other Ambulatory Visit: Payer: Self-pay

## 2020-04-14 ENCOUNTER — Ambulatory Visit (INDEPENDENT_AMBULATORY_CARE_PROVIDER_SITE_OTHER): Payer: Medicaid Other | Admitting: Obstetrics and Gynecology

## 2020-04-14 ENCOUNTER — Ambulatory Visit: Payer: Medicaid Other | Attending: Obstetrics and Gynecology

## 2020-04-14 ENCOUNTER — Encounter: Payer: Self-pay | Admitting: Obstetrics and Gynecology

## 2020-04-14 ENCOUNTER — Encounter: Payer: Self-pay | Admitting: *Deleted

## 2020-04-14 ENCOUNTER — Ambulatory Visit (INDEPENDENT_AMBULATORY_CARE_PROVIDER_SITE_OTHER): Payer: Medicaid Other | Admitting: *Deleted

## 2020-04-14 ENCOUNTER — Ambulatory Visit: Payer: Medicaid Other | Admitting: *Deleted

## 2020-04-14 VITALS — BP 130/82 | HR 106 | Wt 252.8 lb

## 2020-04-14 DIAGNOSIS — O09523 Supervision of elderly multigravida, third trimester: Secondary | ICD-10-CM

## 2020-04-14 DIAGNOSIS — Z362 Encounter for other antenatal screening follow-up: Secondary | ICD-10-CM

## 2020-04-14 DIAGNOSIS — Z8759 Personal history of other complications of pregnancy, childbirth and the puerperium: Secondary | ICD-10-CM

## 2020-04-14 DIAGNOSIS — O0990 Supervision of high risk pregnancy, unspecified, unspecified trimester: Secondary | ICD-10-CM | POA: Diagnosis not present

## 2020-04-14 DIAGNOSIS — O24415 Gestational diabetes mellitus in pregnancy, controlled by oral hypoglycemic drugs: Secondary | ICD-10-CM

## 2020-04-14 DIAGNOSIS — Z3A36 36 weeks gestation of pregnancy: Secondary | ICD-10-CM

## 2020-04-14 DIAGNOSIS — O09293 Supervision of pregnancy with other poor reproductive or obstetric history, third trimester: Secondary | ICD-10-CM

## 2020-04-14 NOTE — Progress Notes (Signed)
[place] for growth/BPP done @ [facility] today.  GAD-7 score = 12. PHQ-9 score = 7.  Pt was offered [facility] and declines again today. Pt states she is anxious about "things" in her life and is over thinking.

## 2020-04-14 NOTE — Progress Notes (Signed)
Subjective:  Wendy Harris is a 39 y.o. [id number] at [redacted]w[redacted]d being seen today for ongoing prenatal care.  She is currently monitored for the following issues for this high-risk pregnancy and has History of gestational diabetes mellitus (GDM); History of gestational hypertension; Supervision of high risk pregnancy, antepartum; AMA (advanced maternal age) multigravida 35+; Proteinuria; Excessive weight gain during pregnancy; and Gestational diabetes mellitus (GDM) in third trimester controlled on oral hypoglycemic drug on their problem list.  Patient reports general discomforts of pregnancy.  Contractions: Irregular. Vag. Bleeding: None.  Movement: Present. Denies leaking of fluid.   The following portions of the patient's history were reviewed and updated as appropriate: allergies, current medications, past family history, past medical history, past social history, past surgical history and problem list. Problem list updated.  Objective:   Vitals:   04/14/20 0946  BP: 130/82  Pulse: (!) 106  Weight: 252 lb 12.8 oz (114.7 kg)    Fetal Status: Fetal Heart Rate (bpm): NST   Movement: Present     General:  Alert, oriented and cooperative. Patient is in no acute distress.  Skin: Skin is warm and dry. No rash noted.   Cardiovascular: Normal heart rate noted  Respiratory: Normal respiratory effort, no problems with respiration noted  Abdomen: Soft, gravid, appropriate for gestational age. Pain/Pressure: Present     Pelvic:  Cervical exam performed        Extremities: Normal range of motion.  Edema: None  Mental Status: Normal mood and affect. Normal behavior. Normal judgment and thought content.   Urinalysis:      Assessment and Plan:  Pregnancy: [id number] at [redacted]w[redacted]d  1. Supervision of high risk pregnancy, antepartum Stable Labor precautions - Strep Gp B NAA - GC/Chlamydia probe amp ([place])not at [facility]  2. Gestational diabetes mellitus (GDM) in third trimester controlled on oral hypoglycemic  drug Uncontrolled. Will schedule for IOL as per MFM recommendations.  Foley as out pt day prior Information provided to pt  3. Multigravida of advanced maternal age in third trimester   4. History of gestational hypertension BP stable  Preterm labor symptoms and general obstetric precautions including but not limited to vaginal bleeding, contractions, leaking of fluid and fetal movement were reviewed in detail with the patient. Please refer to After Visit Summary for other counseling recommendations.  Return in about 4 weeks (around 05/12/2020).   Hermina Staggers, MD

## 2020-04-14 NOTE — Patient Instructions (Addendum)
Vaginal Delivery  Vaginal delivery means that you give birth by pushing your baby out of your birth canal (vagina). A team of health care providers will help you before, during, and after vaginal delivery. Birth experiences are unique for every woman and every pregnancy, and birth experiences vary depending on where you choose to give birth. What happens when I arrive at the birth center or hospital? Once you are in labor and have been admitted into the hospital or birth center, your health care provider may:  Review your pregnancy history and any concerns that you have.  Insert an IV into one of your veins. This may be used to give you fluids and medicines.  Check your blood pressure, pulse, temperature, and heart rate (vital signs).  Check whether your bag of water (amniotic sac) has broken (ruptured).  Talk with you about your birth plan and discuss pain control options. Monitoring Your health care provider may monitor your contractions (uterine monitoring) and your baby's heart rate (fetal monitoring). You may need to be monitored:  Often, but not continuously (intermittently).  All the time or for long periods at a time (continuously). Continuous monitoring may be needed if: ? You are taking certain medicines, such as medicine to relieve pain or make your contractions stronger. ? You have pregnancy or labor complications. Monitoring may be done by:  Placing a special stethoscope or a handheld monitoring device on your abdomen to check your baby's heartbeat and to check for contractions.  Placing monitors on your abdomen (external monitors) to record your baby's heartbeat and the frequency and length of contractions.  Placing monitors inside your uterus through your vagina (internal monitors) to record your baby's heartbeat and the frequency, length, and strength of your contractions. Depending on the type of monitor, it may remain in your uterus or on your baby's head until  birth.  Telemetry. This is a type of continuous monitoring that can be done with external or internal monitors. Instead of having to stay in bed, you are able to move around during telemetry. Physical exam Your health care provider may perform frequent physical exams. This may include:  Checking how and where your baby is positioned in your uterus.  Checking your cervix to determine: ? Whether it is thinning out (effacing). ? Whether it is opening up (dilating). What happens during labor and delivery?  Normal labor and delivery is divided into the following three stages: Stage 1  This is the longest stage of labor.  This stage can last for hours or days.  Throughout this stage, you will feel contractions. Contractions generally feel mild, infrequent, and irregular at first. They get stronger, more frequent (about every 2-3 minutes), and more regular as you move through this stage.  This stage ends when your cervix is completely dilated to 4 inches (10 cm) and completely effaced. Stage 2  This stage starts once your cervix is completely effaced and dilated and lasts until the delivery of your baby.  This stage may last from 20 minutes to 2 hours.  This is the stage where you will feel an urge to push your baby out of your vagina.  You may feel stretching and burning pain, especially when the widest part of your baby's head passes through the vaginal opening (crowning).  Once your baby is delivered, the umbilical cord will be clamped and cut. This usually occurs after waiting a period of 1-2 minutes after delivery.  Your baby will be placed on your bare chest (  skin-to-skin contact) in an upright position and covered with a warm blanket. Watch your baby for feeding cues, like rooting or sucking, and help the baby to your breast for his or her first feeding. Stage 3  This stage starts immediately after the birth of your baby and ends after you deliver the placenta.  This stage may take  anywhere from 5 to 30 minutes.  After your baby has been delivered, you will feel contractions as your body expels the placenta and your uterus contracts to control bleeding. What can I expect after labor and delivery?  After labor is over, you and your baby will be monitored closely until you are ready to go home to ensure that you are both healthy. Your health care team will teach you how to care for yourself and your baby.  You and your baby will stay in the same room (rooming in) during your hospital stay. This will encourage early bonding and successful breastfeeding.  You may continue to receive fluids and medicines through an IV.  Your uterus will be checked and massaged regularly (fundal massage).  You will have some soreness and pain in your abdomen, vagina, and the area of skin between your vaginal opening and your anus (perineum).  If an incision was made near your vagina (episiotomy) or if you had some vaginal tearing during delivery, cold compresses may be placed on your episiotomy or your tear. This helps to reduce pain and swelling.  You may be given a squirt bottle to use instead of wiping when you go to the bathroom. To use the squirt bottle, follow these steps: ? Before you urinate, fill the squirt bottle with warm water. Do not use hot water. ? After you urinate, while you are sitting on the toilet, use the squirt bottle to rinse the area around your urethra and vaginal opening. This rinses away any urine and blood. ? Fill the squirt bottle with clean water every time you use the bathroom.  It is normal to have vaginal bleeding after delivery. Wear a sanitary pad for vaginal bleeding and discharge. Summary  Vaginal delivery means that you will give birth by pushing your baby out of your birth canal (vagina).  Your health care provider may monitor your contractions (uterine monitoring) and your baby's heart rate (fetal monitoring).  Your health care provider may perform  a physical exam.  Normal labor and delivery is divided into three stages.  After labor is over, you and your baby will be monitored closely until you are ready to go home. This information is not intended to replace advice given to you by your health care provider. Make sure you discuss any questions you have with your health care provider. Document Revised: 10/02/2017 Document Reviewed: 10/02/2017 Elsevier Patient Education  2020 Else[employer] Inc.  OUTPATIENT FOLEY BULB INDUCTION OF LABOR:  Information Sheet for Mothers and Family               What's a Foley Bulb Induction? A Foley bulb induction is a procedure where your provider inserts a catheter into your cervix. Once inside your womb, your provider inflates the balloon with a saline solution.   This puts pressure on your cervix and encourages dilation. The catheter falls out once your cervix dilates to 3-4 centimeters.     With any procedure, it's important that you know what to expect. The insertion of a Foley catheter can be a bit uncomfortable, and some women experience sharp pelvic pain. The pain may   subside once the catheter is in place. You may experience some cramping when the Foley catheter is in place.  This is normal.     GO TO THE MATERNITY ADMISSIONS UNIT FOR THE FOLLOWING:  Heavy vaginal bleeding  Rupture of membranes (fluid that wets your underwear)  Painful uterine contractions every 5 minutes or less  Severe abdominal discomfort  Decreased movement of the baby

## 2020-04-15 ENCOUNTER — Encounter (HOSPITAL_COMMUNITY): Payer: Self-pay | Admitting: *Deleted

## 2020-04-15 ENCOUNTER — Telehealth (HOSPITAL_COMMUNITY): Payer: Self-pay | Admitting: *Deleted

## 2020-04-15 ENCOUNTER — Other Ambulatory Visit (HOSPITAL_COMMUNITY)
Admission: RE | Admit: 2020-04-15 | Discharge: 2020-04-15 | Disposition: A | Discharge status: Home / Self Care | Payer: Medicaid Other | Source: Ambulatory Visit | Attending: Family Medicine | Admitting: Family Medicine

## 2020-04-15 DIAGNOSIS — Z01812 Encounter for preprocedural laboratory examination: Secondary | ICD-10-CM | POA: Diagnosis present

## 2020-04-15 DIAGNOSIS — Z20822 Contact with and (suspected) exposure to covid-19: Secondary | ICD-10-CM | POA: Diagnosis not present

## 2020-04-15 LAB — SARS CORONAVIRUS 2 (TAT 6-24 HRS): SARS Coronavirus 2: NEGATIVE

## 2020-04-15 LAB — GC/CHLAMYDIA PROBE AMP (~~LOC~~) NOT AT ARMC
Chlamydia: NEGATIVE
Comment: NEGATIVE
Comment: NORMAL
Neisseria Gonorrhea: NEGATIVE

## 2020-04-15 NOTE — Telephone Encounter (Signed)
Preadmission screen  

## 2020-04-16 ENCOUNTER — Inpatient Hospital Stay (EMERGENCY_DEPARTMENT_HOSPITAL)
Admission: AD | Admit: 2020-04-16 | Discharge: 2020-04-16 | Disposition: A | Discharge status: Home / Self Care | Payer: Medicaid Other | Source: Home / Self Care | Attending: Family Medicine | Admitting: Family Medicine

## 2020-04-16 ENCOUNTER — Encounter (HOSPITAL_COMMUNITY): Payer: Self-pay | Admitting: Family Medicine

## 2020-04-16 ENCOUNTER — Other Ambulatory Visit: Payer: Self-pay | Admitting: Advanced Practice Midwife

## 2020-04-16 ENCOUNTER — Encounter: Payer: Self-pay | Admitting: Obstetrics and Gynecology

## 2020-04-16 ENCOUNTER — Other Ambulatory Visit: Payer: Self-pay

## 2020-04-16 DIAGNOSIS — Z3A36 36 weeks gestation of pregnancy: Secondary | ICD-10-CM

## 2020-04-16 DIAGNOSIS — O09523 Supervision of elderly multigravida, third trimester: Secondary | ICD-10-CM | POA: Insufficient documentation

## 2020-04-16 DIAGNOSIS — O24415 Gestational diabetes mellitus in pregnancy, controlled by oral hypoglycemic drugs: Secondary | ICD-10-CM

## 2020-04-16 DIAGNOSIS — O09293 Supervision of pregnancy with other poor reproductive or obstetric history, third trimester: Secondary | ICD-10-CM | POA: Insufficient documentation

## 2020-04-16 DIAGNOSIS — O3663X0 Maternal care for excessive fetal growth, third trimester, not applicable or unspecified: Secondary | ICD-10-CM

## 2020-04-16 DIAGNOSIS — O0990 Supervision of high risk pregnancy, unspecified, unspecified trimester: Secondary | ICD-10-CM

## 2020-04-16 DIAGNOSIS — Z8759 Personal history of other complications of pregnancy, childbirth and the puerperium: Secondary | ICD-10-CM

## 2020-04-16 DIAGNOSIS — O99820 Streptococcus B carrier state complicating pregnancy: Secondary | ICD-10-CM | POA: Insufficient documentation

## 2020-04-16 DIAGNOSIS — O1213 Gestational proteinuria, third trimester: Secondary | ICD-10-CM | POA: Insufficient documentation

## 2020-04-16 LAB — STREP GP B NAA: Strep Gp B NAA: POSITIVE — AB

## 2020-04-16 MED ORDER — ACETAMINOPHEN 325 MG PO TABS
650.0000 mg | ORAL_TABLET | Freq: Once | ORAL | Status: AC
  Administered 2020-04-16: 650 mg via ORAL
  Filled 2020-04-16: qty 2

## 2020-04-16 NOTE — MAU Provider Note (Signed)
MAU CERVICAL RIPENING NOTE  Subjective:  Wendy Harris is a 39 y.o. [id number] at [redacted]w[redacted]d being seen today for outpatient cervical ripening prior to induction of labor for A2GDM and LGA baby (BPP @ [redacted]w[redacted]d with EFW 99%ile grown to 3746g; MFM recommendation for IOL between 37-[redacted]wk GA).  She is currently monitored for the following issues for this high-risk pregnancy and has History of gestational diabetes mellitus (GDM); History of gestational hypertension; Supervision of high risk pregnancy, antepartum; AMA (advanced maternal age) multigravida 35+; Proteinuria; Excessive weight gain during pregnancy; Gestational diabetes mellitus (GDM) in third trimester controlled on oral hypoglycemic drug; and GBS (group B Streptococcus carrier), +RV culture, currently pregnant on their problem list.  Patient reports headache without vision changes, nearly resolved s/p administration of tylenol in MAU. No chest pain, difficulty breathing or Vag. Bleeding: None. Denies leaking of fluid.   The following portions of the patient's history were reviewed and updated as appropriate: allergies, current medications, past family history, past medical history, past social history, past surgical history and problem list. Problem list updated.  Objective:   Vitals:   04/16/20 1830 04/16/20 1845 04/16/20 1900 04/16/20 1915  BP: 128/79 134/83 139/84 134/73  Pulse: (!) 114 (!) 116 (!) 118 (!) 116  Resp:      Temp:      TempSrc:      SpO2:      Weight:      Height:        Fetal Status: +Fetal movement, Reactive NST before & after FB placement as noted below; Cephalic on Leopold's & on BPP @[redacted]w[redacted]d   General:  Alert, oriented and cooperative. Patient is in no acute distress.  Skin: Skin is warm and dry. No rash noted.   Cardiovascular: Normal heart rate noted  Respiratory: Normal respiratory effort, no problems with respiration noted  Abdomen: Soft, gravid, appropriate for gestational age.        Pelvic: Cervical exam performed:  2/thick/high prior to FB placement  Extremities: Normal range of motion.     Mental Status:  Normal mood and affect. Normal behavior. Normal judgment and thought content. Notably anxious on arrival to MAU but calmed s/p conversation of the plan.  Procedure: Patient informed of R/B/A of procedure. NST was performed and was reactive prior to procedure. NST:  EFM: Baseline: 150 bpm, Variability: Good {> 6 bpm), Accelerations: Reactive and Decelerations: Absent Toco: intermittent Procedure done to begin ripening of the cervix prior to admission for induction of labor. Appropriate time out taken.   The patient was placed in the lithotomy position and a cervical exam was performed and a finger was used to guide the 40F Foley catheter through the internal os of the cervix. Foley Balloon filled with 60cc of normal saline. Plug inserted into end of the foley. Foley placed on tension and taped to medial thigh.   NST:  EFM Baseline: 140 bpm, Variability: Good {> 6 bpm), Accelerations: Reactive and Decelerations: Absent  Toco: intermittent There were no signs of tachysystole or hypertonus. All equipment was removed and accounted for. The patient tolerated the procedure well.  Assessment and Plan:  Pregnancy: at [redacted]w[redacted]d  S/p Outpatient placement of foley balloon catheter for cervical ripening. Induction of labor scheduled for tomorrow at 0840 am. Reassuring FHR tracing with no concerns at present. Warning signs given to patient to include return to MAU for heavy vaginal bleeding, Rupture of membranes, painful uterine contractions q 5 mins or less, severe abdominal discomfort, decreased fetal movement and signs/symptoms of  preeclampsia given pt's history of gHTN in prior pregnancy.  Scheduled IOL on 04/17/20 at 0840.  Sheila Oats, MD 04/16/2020 8:15 PM

## 2020-04-16 NOTE — MAU Note (Signed)
Delay explained to pt, ice chips given per request. No c/o.

## 2020-04-16 NOTE — MAU Note (Addendum)
States feels like she is having more contractions, started up after exam. +FM, scheduled for tomorrow morning.  Pt has several questions regarding GBS infection.

## 2020-04-16 NOTE — Discharge Instructions (Signed)
It was a pleasure to meet you and your sister today! We placed your foley bulb to start the induction process.  Please come to Labor and Delivery tomorrow as planned or call sooner if concerns of active labor as noted below, severe headache, vision changes, elevated blood pressures, chest pain, shortness of breath or other concerning symptoms.  OUTPATIENT FOLEY BULB INDUCTION OF LABOR:  Information Sheet for Mothers and Family               What's a Foley Bulb Induction? A Foley bulb induction is a procedure where your provider inserts a catheter into your cervix. Once inside your womb, your provider inflates the balloon with a saline solution.   This puts pressure on your cervix and encourages dilation. The catheter falls out once your cervix dilates to 3-4 centimeters.     With any procedure, it's important that you know what to expect. The insertion of a Foley catheter can be a bit uncomfortable, and some women experience sharp pelvic pain. The pain may subside once the catheter is in place. You may experience some cramping when the Foley catheter is in place.  This is normal.     GO TO THE MATERNITY ADMISSIONS UNIT FOR THE FOLLOWING:  Heavy vaginal bleeding  Rupture of membranes (fluid that wets your underwear)  Painful uterine contractions every 5 minutes or less  Severe abdominal discomfort  Decreased movement of the baby

## 2020-04-17 ENCOUNTER — Inpatient Hospital Stay (HOSPITAL_COMMUNITY)
Admission: AD | Admit: 2020-04-17 | Discharge: 2020-04-19 | DRG: 806 | Disposition: A | Discharge status: Home / Self Care | Payer: Medicaid Other | Attending: Obstetrics and Gynecology | Admitting: Obstetrics and Gynecology

## 2020-04-17 ENCOUNTER — Inpatient Hospital Stay (HOSPITAL_COMMUNITY): Payer: Medicaid Other | Admitting: Anesthesiology

## 2020-04-17 ENCOUNTER — Encounter (HOSPITAL_COMMUNITY): Payer: Self-pay | Admitting: Obstetrics and Gynecology

## 2020-04-17 ENCOUNTER — Inpatient Hospital Stay (HOSPITAL_COMMUNITY): Payer: Medicaid Other

## 2020-04-17 DIAGNOSIS — Z8759 Personal history of other complications of pregnancy, childbirth and the puerperium: Secondary | ICD-10-CM

## 2020-04-17 DIAGNOSIS — O99214 Obesity complicating childbirth: Secondary | ICD-10-CM | POA: Diagnosis present

## 2020-04-17 DIAGNOSIS — Z3A37 37 weeks gestation of pregnancy: Secondary | ICD-10-CM | POA: Diagnosis not present

## 2020-04-17 DIAGNOSIS — E669 Obesity, unspecified: Secondary | ICD-10-CM | POA: Diagnosis present

## 2020-04-17 DIAGNOSIS — Z87891 Personal history of nicotine dependence: Secondary | ICD-10-CM | POA: Diagnosis not present

## 2020-04-17 DIAGNOSIS — O99824 Streptococcus B carrier state complicating childbirth: Secondary | ICD-10-CM | POA: Diagnosis present

## 2020-04-17 DIAGNOSIS — O3663X0 Maternal care for excessive fetal growth, third trimester, not applicable or unspecified: Secondary | ICD-10-CM | POA: Diagnosis not present

## 2020-04-17 DIAGNOSIS — Z3A36 36 weeks gestation of pregnancy: Secondary | ICD-10-CM | POA: Diagnosis not present

## 2020-04-17 DIAGNOSIS — O24425 Gestational diabetes mellitus in childbirth, controlled by oral hypoglycemic drugs: Secondary | ICD-10-CM | POA: Diagnosis present

## 2020-04-17 DIAGNOSIS — IMO0002 Reserved for concepts with insufficient information to code with codable children: Secondary | ICD-10-CM | POA: Diagnosis present

## 2020-04-17 DIAGNOSIS — O134 Gestational [pregnancy-induced] hypertension without significant proteinuria, complicating childbirth: Principal | ICD-10-CM | POA: Diagnosis present

## 2020-04-17 DIAGNOSIS — O24415 Gestational diabetes mellitus in pregnancy, controlled by oral hypoglycemic drugs: Secondary | ICD-10-CM | POA: Diagnosis not present

## 2020-04-17 LAB — COMPREHENSIVE METABOLIC PANEL
ALT: 14 U/L (ref 0–44)
ALT: 13 U/L (ref 0–44)
AST: 17 U/L (ref 15–41)
AST: 18 U/L (ref 15–41)
Albumin: 2.7 g/dL — ABNORMAL LOW (ref 3.5–5.0)
Albumin: 2.7 g/dL — ABNORMAL LOW (ref 3.5–5.0)
Alkaline Phosphatase: 131 U/L — ABNORMAL HIGH (ref 38–126)
Alkaline Phosphatase: 124 U/L (ref 38–126)
Anion gap: 11 (ref 5–15)
Anion gap: 11 (ref 5–15)
BUN: 7 mg/dL (ref 6–20)
BUN: 6 mg/dL (ref 6–20)
CO2: 18 mmol/L — ABNORMAL LOW (ref 22–32)
CO2: 22 mmol/L (ref 22–32)
Calcium: 9.4 mg/dL (ref 8.9–10.3)
Calcium: 9.3 mg/dL (ref 8.9–10.3)
Chloride: 104 mmol/L (ref 98–111)
Chloride: 103 mmol/L (ref 98–111)
Creatinine, Ser: 0.62 mg/dL (ref 0.44–1.00)
Creatinine, Ser: 0.65 mg/dL (ref 0.44–1.00)
GFR calc Af Amer: >60 mL/min (ref >60)
GFR calc Af Amer: >60 mL/min (ref >60)
GFR calc non Af Amer: >60 mL/min (ref >60)
GFR calc non Af Amer: >60 mL/min (ref >60)
Glucose, Bld: 203 mg/dL — ABNORMAL HIGH (ref 70–99)
Glucose, Bld: 104 mg/dL — ABNORMAL HIGH (ref 70–99)
Potassium: 4.1 mmol/L (ref 3.5–5.1)
Potassium: 4.1 mmol/L (ref 3.5–5.1)
Sodium: 133 mmol/L — ABNORMAL LOW (ref 135–145)
Sodium: 136 mmol/L (ref 135–145)
Total Bilirubin: 0.7 mg/dL (ref 0.3–1.2)
Total Bilirubin: 0.8 mg/dL (ref 0.3–1.2)
Total Protein: 6.6 g/dL (ref 6.5–8.1)
Total Protein: 6.7 g/dL (ref 6.5–8.1)

## 2020-04-17 LAB — GLUCOSE, CAPILLARY
Glucose-Capillary: 107 mg/dL — ABNORMAL HIGH (ref 70–99)
Glucose-Capillary: 114 mg/dL — ABNORMAL HIGH (ref 70–99)
Glucose-Capillary: 124 mg/dL — ABNORMAL HIGH (ref 70–99)
Glucose-Capillary: 147 mg/dL — ABNORMAL HIGH (ref 70–99)
Glucose-Capillary: 86 mg/dL (ref 70–99)
Glucose-Capillary: 97 mg/dL (ref 70–99)

## 2020-04-17 LAB — PROTEIN / CREATININE RATIO, URINE
Creatinine, Urine: 329.76 mg/dL
Creatinine, Urine: 153.02 mg/dL
Protein Creatinine Ratio: 0.17 mg/mg{Cre} — ABNORMAL HIGH (ref 0.00–0.15)
Protein Creatinine Ratio: 0.16 mg/mg{Cre} — ABNORMAL HIGH (ref 0.00–0.15)
Total Protein, Urine: 55 mg/dL
Total Protein, Urine: 24 mg/dL

## 2020-04-17 LAB — ABO/RH: ABO/RH(D): O POS

## 2020-04-17 LAB — CBC
HCT: 34.5 % — ABNORMAL LOW (ref 36.0–46.0)
HCT: 33.4 % — ABNORMAL LOW (ref 36.0–46.0)
Hemoglobin: 11.4 g/dL — ABNORMAL LOW (ref 12.0–15.0)
Hemoglobin: 10.8 g/dL — ABNORMAL LOW (ref 12.0–15.0)
MCH: 30 pg (ref 26.0–34.0)
MCH: 29.4 pg (ref 26.0–34.0)
MCHC: 33 g/dL (ref 30.0–36.0)
MCHC: 32.3 g/dL (ref 30.0–36.0)
MCV: 90.8 fL (ref 80.0–100.0)
MCV: 91 fL (ref 80.0–100.0)
Platelets: 406 10*3/uL — ABNORMAL HIGH (ref 150–400)
Platelets: 381 10*3/uL (ref 150–400)
RBC: 3.8 MIL/uL — ABNORMAL LOW (ref 3.87–5.11)
RBC: 3.67 MIL/uL — ABNORMAL LOW (ref 3.87–5.11)
RDW: 12.8 % (ref 11.5–15.5)
RDW: 12.9 % (ref 11.5–15.5)
WBC: 10.5 10*3/uL (ref 4.0–10.5)
WBC: 12 10*3/uL — ABNORMAL HIGH (ref 4.0–10.5)
nRBC: 0 % (ref 0.0–0.2)
nRBC: 0 % (ref 0.0–0.2)

## 2020-04-17 LAB — TYPE AND SCREEN
ABO/RH(D): O POS
Antibody Screen: NEGATIVE

## 2020-04-17 LAB — RPR: RPR Ser Ql: NONREACTIVE

## 2020-04-17 MED ORDER — PHENYLEPHRINE 40 MCG/ML (10ML) SYRINGE FOR IV PUSH (FOR BLOOD PRESSURE SUPPORT): 80.0000 ug | PREFILLED_SYRINGE | INTRAVENOUS | Status: DC | PRN

## 2020-04-17 MED ORDER — LACTATED RINGERS IV SOLN: INTRAVENOUS | Status: DC

## 2020-04-17 MED ORDER — EPHEDRINE 5 MG/ML INJ: 10.0000 mg | INTRAVENOUS | Status: DC | PRN

## 2020-04-17 MED ORDER — LIDOCAINE HCL (PF) 1 % IJ SOLN: 30.0000 mL | INTRAMUSCULAR | Status: DC | PRN

## 2020-04-17 MED ORDER — LIDOCAINE HCL (PF) 1 % IJ SOLN
INTRAMUSCULAR | Status: DC | PRN
  Administered 2020-04-17: 5 mL via EPIDURAL
  Administered 2020-04-17: 4 mL via EPIDURAL

## 2020-04-17 MED ORDER — OXYCODONE-ACETAMINOPHEN 5-325 MG PO TABS: 1.0000 | ORAL_TABLET | ORAL | Status: DC | PRN

## 2020-04-17 MED ORDER — LACTATED RINGERS IV SOLN: 500.0000 mL | INTRAVENOUS | Status: DC | PRN

## 2020-04-17 MED ORDER — LACTATED RINGERS IV SOLN
500.0000 mL | Freq: Once | INTRAVENOUS | Status: AC
  Administered 2020-04-17: 500 mL via INTRAVENOUS

## 2020-04-17 MED ORDER — OXYTOCIN-SODIUM CHLORIDE 30-0.9 UT/500ML-% IV SOLN: 2.5000 [IU]/h | INTRAVENOUS | Status: DC

## 2020-04-17 MED ORDER — OXYTOCIN BOLUS FROM INFUSION
333.0000 mL | Freq: Once | INTRAVENOUS | Status: AC
  Administered 2020-04-18: 333 mL via INTRAVENOUS

## 2020-04-17 MED ORDER — FENTANYL CITRATE (PF) 100 MCG/2ML IJ SOLN
50.0000 ug | INTRAMUSCULAR | Status: DC | PRN
  Administered 2020-04-17 (×2): 100 ug via INTRAVENOUS
  Filled 2020-04-17 (×2): qty 2

## 2020-04-17 MED ORDER — TERBUTALINE SULFATE 1 MG/ML IJ SOLN: 0.2500 mg | Freq: Once | INTRAMUSCULAR | Status: DC | PRN

## 2020-04-17 MED ORDER — ACETAMINOPHEN 325 MG PO TABS
650.0000 mg | ORAL_TABLET | ORAL | Status: DC | PRN
  Administered 2020-04-18: 650 mg via ORAL
  Filled 2020-04-17: qty 2

## 2020-04-17 MED ORDER — SOD CITRATE-CITRIC ACID 500-334 MG/5ML PO SOLN: 30.0000 mL | ORAL | Status: DC | PRN

## 2020-04-17 MED ORDER — FENTANYL-BUPIVACAINE-NACL 0.5-0.125-0.9 MG/250ML-% EP SOLN
12.0000 mL/h | EPIDURAL | Status: DC | PRN
  Filled 2020-04-17: qty 250

## 2020-04-17 MED ORDER — PENICILLIN G POT IN DEXTROSE 60000 UNIT/ML IV SOLN
3.0000 10*6.[IU] | INTRAVENOUS | Status: DC
  Administered 2020-04-17 (×3): 3 10*6.[IU] via INTRAVENOUS
  Filled 2020-04-17 (×5): qty 50

## 2020-04-17 MED ORDER — ONDANSETRON HCL 4 MG/2ML IJ SOLN: 4.0000 mg | Freq: Four times a day (QID) | INTRAMUSCULAR | Status: DC | PRN

## 2020-04-17 MED ORDER — DIPHENHYDRAMINE HCL 50 MG/ML IJ SOLN: 12.5000 mg | INTRAMUSCULAR | Status: DC | PRN

## 2020-04-17 MED ORDER — OXYCODONE-ACETAMINOPHEN 5-325 MG PO TABS: 2.0000 | ORAL_TABLET | ORAL | Status: DC | PRN

## 2020-04-17 MED ORDER — SODIUM CHLORIDE 0.9 % IV SOLN
5.0000 10*6.[IU] | Freq: Once | INTRAVENOUS | Status: AC
  Administered 2020-04-17: 5 10*6.[IU] via INTRAVENOUS
  Filled 2020-04-17: qty 5

## 2020-04-17 MED ORDER — OXYTOCIN-SODIUM CHLORIDE 30-0.9 UT/500ML-% IV SOLN
1.0000 m[IU]/min | INTRAVENOUS | Status: DC
  Administered 2020-04-17: 2 m[IU]/min via INTRAVENOUS
  Filled 2020-04-17: qty 500

## 2020-04-17 MED ORDER — SODIUM CHLORIDE (PF) 0.9 % IJ SOLN
INTRAMUSCULAR | Status: DC | PRN
  Administered 2020-04-17: 12 mL/h via EPIDURAL

## 2020-04-17 NOTE — H&P (Addendum)
OBSTETRIC ADMISSION HISTORY AND PHYSICAL  Wendy Harris is a 39 y.o. female [phone] with IUP at [redacted]w[redacted]d by 6 wk [place] presenting for induction of labor for gHTN and GDM. She reports +FMs, No LOF, no VB, no blurry vision, headaches or peripheral edema, and RUQ pain.  She plans on breast feeding. She request Nexplanon for birth control. She received her prenatal [facility] at [facility]   Dating: By 6 wk [place] --->  Estimated Date of Delivery: 05/08/20  Sono: @[redacted]w[redacted]d , CWD, normal anatomy, cephalic presentation, 3746g, EFW   Prenatal History/Complications: LGA (99%) gHTN GDM Hx of previous vacuum delivery  Past Medical History: Past Medical History:  Diagnosis Date  . Eczema   . Gestational diabetes    Diet controlled  . History of gestational diabetes mellitus (GDM)   . Hypertension    2nd pregnancy , only  . Proteinuria    during 2nd baby pregnancy    Past Surgical History: Past Surgical History:  Procedure Laterality Date  . NO PAST SURGERIES      Obstetrical History: OB History    Gravida  5   Para  2   Term  2   Preterm      AB  2   Living  2     SAB  1   TAB      Ectopic      Multiple      Live Births  2           Social History: Social History   Socioeconomic History  . Marital status: Single    Spouse name: Not on file  . Number of children: Not on file  . Years of education: Not on file  . Highest education level: Not on file  Occupational History  . Not on file  Tobacco Use  . Smoking status: Former Smoker    Types: Cigarettes    Quit date: 09/12/2019    Years since quitting: 0.5  . Smokeless tobacco: Never Used  Vaping Use  . Vaping Use: Never used  Substance and Sexual Activity  . Alcohol use: Not Currently    Comment: socially  . Drug use: Never  . Sexual activity: Yes    Birth control/protection: None  Other Topics Concern  . Not on file  Social History Narrative  . Not on file   Social Determinants of Health   Financial Resource  Strain:   . Difficulty of Paying Living Expenses:   Food Insecurity: No Food Insecurity  . Worried About 11/10/2019 in the Last Year: Never true  . Ran Out of Food in the Last Year: Never true  Transportation Needs: No Transportation Needs  . Lack of Transportation (Medical): No  . Lack of Transportation (Non-Medical): No  Physical Activity:   . Days of Exercise per Week:   . Minutes of Exercise per Session:   Stress:   . Feeling of Stress :   Social Connections:   . Frequency of Communication with Friends and Family:   . Frequency of Social Gatherings with Friends and Family:   . Attends Religious Services:   . Active Member of Clubs or Organizations:   . Attends Programme researcher, broadcasting/film/video Meetings:   Banker Marital Status:     Family History: Family History  Problem Relation Age of Onset  . Diabetes Mother     Allergies: No Known Allergies  Medications Prior to Admission  Medication Sig Dispense Refill Last Dose  . metFORMIN (GLUCOPHAGE) 500  MG tablet Take 2 tablets (1,000 mg total) by mouth 2 (two) times daily with a meal. 60 tablet 5 Past Week at Unknown time  . prenatal vitamin w/FE, FA (NATACHEW) 29-1 MG CHEW chewable tablet Chew 1 tablet by mouth daily at 12 noon. 30 tablet 3 Past Week at Unknown time  . Accu-Chek Softclix Lancets lancets To check blood sugar 4 times a day as instructed. 100 each 12   . glucose blood (ACCU-CHEK GUIDE) test strip To check blood sugars 4 times a day as instructed 100 each 12   . triamcinolone ointment (KENALOG) 0.5 % Apply 1 application topically 2 (two) times daily. 30 g 5      Review of Systems   All systems reviewed and negative except as stated in HPI  There were no vitals taken for this visit. General appearance: alert, cooperative and appears stated age Lungs: clear to auscultation bilaterally Heart: regular rate and rhythm Abdomen: soft, non-tender; bowel sounds normal Pelvic: adequate pelvis. 3.5/40/-3  cephalic Extremities: Homans sign is negative, no sign of DV Presentation: cephalic Fetal monitoringBaseline: 145 bpm, Variability: Good {> 6 bpm), Accelerations: Reactive and Decelerations: Absent Uterine activity infrequent     Prenatal labs: ABO, Rh: O/Positive/-- (05/18 1127) Antibody: Negative (05/18 1127) Rubella: 1.56 (05/18 1127) RPR: Non Reactive (05/18 1127)  HBsAg: Negative (05/18 1127)  HIV: Non Reactive (05/18 1127)  GBS: Positive/-- (08/04 1033)  1 hr Glucola elevated Genetic screening  declined Anatomy US normal - LGA at 36 weeks  Prenatal Transfer Tool  Maternal Diabetes: Yes:  Diabetes Type:  Insulin/Medication controlled Genetic Screening: Declined Maternal Ultrasounds/Referrals: Normal and Other:LGA Fetal Ultrasounds or other Referrals:  None Maternal Substance Abuse:  No Significant Maternal Medications:  Meds include: Zoloft Other: metformin Significant Maternal Lab Results: Group B Strep positive  No results found for this or any previous visit (from the past 24 hour(s)).  Patient Active Problem List   Diagnosis Date Noted  . Uncontrolled diabetes mellitus (HCC) 04/17/2020  . GBS (group B Streptococcus carrier), +RV culture, currently pregnant 04/16/2020  . Gestational diabetes mellitus (GDM) in third trimester controlled on oral hypoglycemic drug 03/17/2020  . Excessive weight gain during pregnancy 01/27/2020  . Supervision of high risk pregnancy, antepartum 10/14/2019  . AMA (advanced maternal age) multigravida 35+ 10/14/2019  . History of gestational diabetes mellitus (GDM)   . History of gestational hypertension   . Proteinuria     Assessment/Plan:  Wendy Harris is a 39 y.o. [id number] at [redacted]w[redacted]d here for IOL for gHTN and GDM.  #Labor: S/P FB. Will plan to start induction with pit once she has eaten breakfast. #Pain: Plan for epidural later in the the labor #FWB: Cat I #ID:  GBS pos, PCN #MOF: breast #MOC: Nexplanon #Circ:  yes  Wendy Mo,  MD  04/17/2020, 8:16 AM  Attestation of Supervision of Student:  I confirm that I have verified the information documented in the resident student's note and that I have also personally reperformed the history, physical exam and all medical decision making activities.  I have verified that all services and findings are accurately documented in this student's note; and I agree with management and plan as outlined in the documentation. I have also made any necessary editorial changes.  Marylene Land, CNM [facility] for [employer], [facility] 04/17/2020 1:45 PM

## 2020-04-17 NOTE — Anesthesia Preprocedure Evaluation (Addendum)
Anesthesia Evaluation  Patient identified by MRN, date of birth, ID band Patient awake    Reviewed: Allergy & Precautions, Patient's Chart, lab work & pertinent test results  Airway Mallampati: II  TM Distance: >3 FB Neck ROM: Full    Dental  (+) Teeth Intact   Pulmonary former smoker,    Pulmonary exam normal breath sounds clear to auscultation       Cardiovascular hypertension, Normal cardiovascular exam Rhythm:Regular Rate:Normal     Neuro/Psych negative neurological ROS  negative psych ROS   GI/Hepatic Neg liver ROS, GERD  ,  Endo/Other  diabetes, Poorly Controlled, Gestational, Oral Hypoglycemic AgentsObesity  Renal/GU negative Renal ROS  negative genitourinary   Musculoskeletal negative musculoskeletal ROS (+)   Abdominal (+) + obese,   Peds  Hematology  (+) anemia ,   Anesthesia Other Findings   Reproductive/Obstetrics (+) Pregnancy Gestational DM and HTN 37 weeks                            Anesthesia Physical Anesthesia Plan  ASA: II  Anesthesia Plan: Epidural   Post-op Pain Management:    Induction:   PONV Risk Score and Plan:   Airway Management Planned: Natural Airway  Additional Equipment:   Intra-op Plan:   Post-operative Plan:   Informed Consent: I have reviewed the patients History and Physical, chart, labs and discussed the procedure including the risks, benefits and alternatives for the proposed anesthesia with the patient or authorized representative who has indicated his/her understanding and acceptance.       Plan Discussed with: Anesthesiologist  Anesthesia Plan Comments:         Anesthesia Quick Evaluation

## 2020-04-17 NOTE — Progress Notes (Signed)
° °  Wendy Harris is a 39 y.o. [id number] at [redacted]w[redacted]d  admitted for induction of labor due to [facility] on metformin and GHTN. She denies headache, blurry vision, floating spots, RUQ pain.   Subjective: Feeling pain; rate 8/10 and desiring epidural  Objective: Vitals:   04/17/20 1030 04/17/20 1203 04/17/20 1334 04/17/20 1531  BP: 131/83 120/63 132/87 129/73  Pulse: (!) 108 98 100 97  Resp: 18 18  18   Temp:    97.9 F (36.6 C)  TempSrc:    Oral  Weight:      Height:       No intake/output data recorded.  FHT:  FHR: 120 bpm, variability: moderate,  accelerations:  Present,  decelerations:  Absent UC:   regular, every 2-3 minutes SVE:   Dilation: 5 Effacement (%): 60, 70 Station: -3 Exam by:: K. Yasaman Kolek CNM  Pitocin @ 12 mu/min  Labs: Lab Results  Component Value Date   WBC 10.5 04/17/2020   HGB 11.4 (L) 04/17/2020   HCT 34.5 (L) 04/17/2020   MCV 90.8 04/17/2020   PLT 406 (H) 04/17/2020    Assessment / Plan: IOL for GDMA2 on metformin and gHTN. Currently no signs or symptoms of pre-eclampsia.   Labor: Progressing on Pitocin, will continue to increase then AROM after patient gets epidural Fetal Wellbeing:  Category I Pain Control:  Labor support without medications Anticipated MOD:  NSVD  06/17/2020 04/17/2020, 4:34 PM

## 2020-04-17 NOTE — Anesthesia Procedure Notes (Signed)
Epidural Patient location during procedure: OB Start time: 04/17/2020 4:51 PM End time: 04/17/2020 4:59 PM  Staffing Anesthesiologist: Mal Amabile, MD Performed: anesthesiologist   Preanesthetic Checklist Completed: patient identified, IV checked, site marked, risks and benefits discussed, surgical consent, monitors and equipment checked, pre-op evaluation and timeout performed  Epidural Patient position: sitting Prep: DuraPrep and site prepped and draped Patient monitoring: continuous pulse ox and blood pressure Approach: midline Location: L4-L5 Injection technique: LOR air  Needle:  Needle type: Tuohy  Needle gauge: 17 G Needle length: 9 cm and 9 Needle insertion depth: 7 cm Catheter type: closed end flexible Catheter size: 19 Gauge Catheter at skin depth: 12 cm Test dose: negative and Other  Assessment Events: blood not aspirated, injection not painful, no injection resistance, no paresthesia and negative IV test  Additional Notes Patient identified. Risks and benefits discussed including failed block, incomplete  Pain control, post dural puncture headache, nerve damage, paralysis, blood pressure Changes, nausea, vomiting, reactions to medications-both toxic and allergic and post Partum back pain. All questions were answered. Patient expressed understanding and wished to proceed. Sterile technique was used throughout procedure. Epidural site was Dressed with sterile barrier dressing. No paresthesias, signs of intravascular injection Or signs of intrathecal spread were encountered. Attempt x 3. Difficult due to poor positioning and Obesity. Patient was more comfortable after the epidural was dosed. Please see RN's note for documentation of vital signs and FHR which are stable. Reason for block:procedure for pain

## 2020-04-17 NOTE — Progress Notes (Signed)
Wendy Harris, [id number]  Subjective -Care assumed of 39 y.o. [id number] at [redacted]w[redacted]d who presents for IOL s/t GHTN. Pregnancy and medical history significant for excessive weight gain, suspected LGA infant, GBS Positive, Uncontrolled Diabetes.  In room to meet acquaintance of patient and family.  Patient resting in bed and reports some pressure, but feeling well.  She endorses fetal movement and denies concerns.    Objective Vitals:   04/17/20 1931 04/17/20 2000  BP: (!) 147/82 (!) 147/73  Pulse: (!) 107 (!) 107  Resp: 18 18  Temp:    SpO2:     No intake/output data recorded. No intake/output data recorded.  Physical Exam Constitutional:      Appearance: Normal appearance.  HENT:     Head: Normocephalic and atraumatic.  Eyes:     Conjunctiva/sclera: Conjunctivae normal.  Cardiovascular:     Rate and Rhythm: Normal rate and regular rhythm.     Heart sounds: Normal heart sounds.  Pulmonary:     Effort: Pulmonary effort is normal. No respiratory distress.     Breath sounds: Normal breath sounds.  Abdominal:     Comments: Gravid--fundal height appears LGA, Soft, NT   Musculoskeletal:        General: Normal range of motion.     Cervical back: Normal range of motion.  Skin:    General: Skin is warm and dry.  Neurological:     Mental Status: She is alert and oriented to person, place, and time.  Psychiatric:        Mood and Affect: Mood normal.        Thought Content: Thought content normal.     RCV:ELFYBOFB: 7 Effacement (%): 80 Cervical Position: Posterior Station: -1 Presentation: Vertex Exam by:: M Basiliere RN  Membranes:  Pelvis: -Proven:6lbs 4oz -Adequate: TBD Leopolds: -Position:Vertex -EFW:3746gram by [place] on 8/4  FHR: 145 bpm, Mod Var, + Late/Variable Decels, -Accels UC: Q1-2 min, MVUS at  Management Plan: Expectant:N/A Augmentation:N/A Induction Phase: S/P Foley Bulb, Pitocin at 34mUn/min  Assessment IUP at 37wks Cat II FT GBS  Positive  Plan -Reviewed POC in regards to  patient expectations and current labor phase. -Patient s/p epidural and comfortable. -Dr. Debroah Loop to be notified of Cat II tracing.  -Reviewed maneuvers and risk to infant during Shoulder Dystocia including but not limited to:  -Facial bruising  -Clavicle fracture/breakage -Damage to brachial plexus nerve resulting in inability to move arm temporarily or permanently -Slow transition to extrauterine life resulting in need for intervention including but not limited to additional personnel and NICU observation and/or admission.  -Patient without questions and concerns addressed. -Continue other mgmt as ordered  Cherre Robins, CNM 04/17/2020, 8:31 PM

## 2020-04-17 NOTE — Progress Notes (Signed)
OP foley balloon removed. Pt. Tolerated well. SVE deferred at this time.  E. Dejha King RN

## 2020-04-17 NOTE — Progress Notes (Signed)
Labor Progress Note Aya Geisel is a 39 y.o. [id number] at [redacted]w[redacted]d presented for IOL for gHTN and LGA.  S: Increasing discomfort with contractions.  Not ready for epidural yet.  No HA/vision changes.    O:  BP 120/63   Pulse 98   Temp 99.1 F (37.3 C) (Oral)   Resp 18   Ht 5\' 8"  (1.727 m)   Wt 115.7 kg   LMP  (LMP Unknown)   BMI 38.78 kg/m  EFM: 150/moderate var/accels, no decels  CVE: Dilation: 5 Effacement (%): 40 Cervical Position: Posterior Station: -3 Presentation: Vertex Exam by:: Dr. [email or web address]    A&P: 39 y.o. 24 [redacted]w[redacted]d here for IOL for gHTN and LGA.  #Labor: Progressing well. Will continue Pit for now. History concerning for possible labor dystocia. #Pain: PO or IV PRN for now.  Will plan for epidural closer to delivery. #FWB: Cat I #GBS positive on PCN #gHTN: well controled in hospital since admission. PIH labs normal. #GDMA2: Q4 hour CBGs in latent labor Q2 in active.  [redacted]w[redacted]d, MD 1:32 PM

## 2020-04-17 NOTE — Progress Notes (Signed)
Labor Progress Note Wendy Harris is a 39 y.o. [id number] at [redacted]w[redacted]d presented for IOL for gHTN and LGA.  S: increasing pelvic pressure. No headaches or vision changes.    O:  BP (!) 148/92   Pulse 99   Temp 97.8 F (36.6 C) (Oral)   Resp 18   Ht 5\' 8"  (1.727 m)   Wt 115.7 kg   LMP  (LMP Unknown)   SpO2 100%   BMI 38.78 kg/m  EFM: Prolonged decel followed by: 150/moderate var/early decels  CVE: Dilation: 6 Effacement (%): 70 Cervical Position: Posterior Station: -3 Presentation: Vertex Exam by:: Dr. [email or web address]    A&P: 39 y.o. 24 [redacted]w[redacted]d here for IOL for gHTN and LGA.  #Labor: Progressing well. IUPC and FSE placed.  Strip improved with position changes.  Pit cut down to 6.  Will continue to monitor. #Pain: epidural in place. Well controlled. #FWB: Cat II, now reassuring.  Prolonged decel followed by a good recovery to a baseline of 150 with moderate variability and recurrent early decels. #GBS Positive, PCN #gHTN: elevated BP for the past 1.5 hours.  Systolic pressure 150s, diastolic pressure 90s.  Will draw PIH labs again and continue to monitor. #GDM: CBGs Q4 hours in latent. Q2 hours in active. CBGs under 120 for the past 2 checks.  [redacted]w[redacted]d, MD 6:21 PM

## 2020-04-17 NOTE — Progress Notes (Addendum)
Wendy Harris MRN: [id number]  Subjective: -Nurse calls reports patient C/C/+1, 0 with caput.  States patient with urge to push.  Provider to bedside.  Patient reports feeling urge and pushing with guidance.  Appears tired.   Objective: BP 140/82   Pulse (!) 109   Temp 98.7 F (37.1 C) (Axillary)   Resp 18   Ht 5\' 8"  (1.727 m)   Wt 115.7 kg   LMP  (LMP Unknown)   SpO2 100%   BMI 38.78 kg/m  No intake/output data recorded. No intake/output data recorded.  Fetal Monitoring: FHT: 135 bpm, Mod Var, +Variable/Late Decels, +Accels UC: Q41min    Vaginal Exam: SVE:   Dilation: 10 Effacement (%): 100 Station: 0 Exam by:: [email or web address] Membranes:SROM x 5hrs Internal Monitors: IUPC, FSE in place  Augmentation/Induction: Pitocin:6Mun/min Cytotec: None  Assessment:  IUP at 37 weeks IOL s/t GHTN Cat I FT   Plan: -Patient to continue to push. -Provider will remain at bedside or close to room. -Continue other mgmt as ordered   Production designer, theatre/television/film, CNM Advanced Practice Provider, [facility] 04/17/2020, 10:34 PM  Addendum 1055 SVE reveals anterior lip that is not easily reduced. Patient and nurse informed of findings.  Discussed need for position changes and recheck later. Patient agreeable and without questions or concerns.   06/17/2020 MSN, CNM Advanced Practice Provider, Center for Cherre Robins

## 2020-04-17 NOTE — Progress Notes (Signed)
Labor Progress Note Wendy Harris is a 39 y.o. [id number] at [redacted]w[redacted]d presented for IOL for gHTN and LGA.  S: Epidural recently placed.  Good pain control.  No new concerns at this time.  O:  BP (!) 152/87   Pulse (!) 102   Temp 97.9 F (36.6 C) (Oral)   Resp 18   Ht 5\' 8"  (1.727 m)   Wt 115.7 kg   LMP  (LMP Unknown)   SpO2 100%   BMI 38.78 kg/m  EFM: 140/moderate var/pos accels, no decels  CVE: Dilation: 5 Effacement (%): 60, 70 Cervical Position: Posterior Station: -3 Presentation: Vertex Exam by:: [email or web address] CNM    A&P: 39 y.o. 24 [redacted]w[redacted]d here for IOL for gHTN and LGA.  #Labor: Progressing well. Epidural placed.  AROM with clear fluid.  Will continue Pit. #Pain: epidural in place. Well controlled. #FWB: Cat I #GBS Positive, PCN #gHTN: elevated BP for the past 1.5 hours.  Systolic pressure 150s, diastolic pressure 90s.  Will draw PIH labs again and continue to monitor. #GDM: CBGs Q4 hours in latent. Q2 hours in active. CBGs under 120 for the past 2 checks.  [redacted]w[redacted]d, MD 5:44 PM

## 2020-04-18 ENCOUNTER — Encounter (HOSPITAL_COMMUNITY): Payer: Self-pay | Admitting: Obstetrics and Gynecology

## 2020-04-18 DIAGNOSIS — Z3A37 37 weeks gestation of pregnancy: Secondary | ICD-10-CM

## 2020-04-18 LAB — GLUCOSE, CAPILLARY: Glucose-Capillary: 246 mg/dL — ABNORMAL HIGH (ref 70–99)

## 2020-04-18 MED ORDER — COCONUT OIL OIL: 1.0000 "application " | TOPICAL_OIL | Status: DC | PRN

## 2020-04-18 MED ORDER — DIBUCAINE (PERIANAL) 1 % EX OINT: 1.0000 "application " | TOPICAL_OINTMENT | CUTANEOUS | Status: DC | PRN

## 2020-04-18 MED ORDER — ONDANSETRON HCL 4 MG/2ML IJ SOLN: 4.0000 mg | INTRAMUSCULAR | Status: DC | PRN

## 2020-04-18 MED ORDER — TETANUS-DIPHTH-ACELL PERTUSSIS 5-2.5-18.5 LF-MCG/0.5 IM SUSP: 0.5000 mL | Freq: Once | INTRAMUSCULAR | Status: DC

## 2020-04-18 MED ORDER — IBUPROFEN 600 MG PO TABS
600.0000 mg | ORAL_TABLET | Freq: Four times a day (QID) | ORAL | Status: DC
  Administered 2020-04-18 – 2020-04-19 (×6): 600 mg via ORAL
  Filled 2020-04-18 (×6): qty 1

## 2020-04-18 MED ORDER — WITCH HAZEL-GLYCERIN EX PADS: 1.0000 "application " | MEDICATED_PAD | CUTANEOUS | Status: DC | PRN

## 2020-04-18 MED ORDER — SIMETHICONE 80 MG PO CHEW: 80.0000 mg | CHEWABLE_TABLET | ORAL | Status: DC | PRN

## 2020-04-18 MED ORDER — PRENATAL MULTIVITAMIN CH
1.0000 | ORAL_TABLET | Freq: Every day | ORAL | Status: DC
  Administered 2020-04-18 – 2020-04-19 (×2): 1 via ORAL
  Filled 2020-04-18 (×2): qty 1

## 2020-04-18 MED ORDER — DIPHENHYDRAMINE HCL 25 MG PO CAPS: 25.0000 mg | ORAL_CAPSULE | Freq: Four times a day (QID) | ORAL | Status: DC | PRN

## 2020-04-18 MED ORDER — ZOLPIDEM TARTRATE 5 MG PO TABS: 5.0000 mg | ORAL_TABLET | Freq: Every evening | ORAL | Status: DC | PRN

## 2020-04-18 MED ORDER — ACETAMINOPHEN 325 MG PO TABS
650.0000 mg | ORAL_TABLET | ORAL | Status: DC | PRN
  Administered 2020-04-18 – 2020-04-19 (×2): 650 mg via ORAL
  Filled 2020-04-18: qty 2

## 2020-04-18 MED ORDER — BENZOCAINE-MENTHOL 20-0.5 % EX AERO: 1.0000 "application " | INHALATION_SPRAY | CUTANEOUS | Status: DC | PRN

## 2020-04-18 MED ORDER — ONDANSETRON HCL 4 MG PO TABS: 4.0000 mg | ORAL_TABLET | ORAL | Status: DC | PRN

## 2020-04-18 MED ORDER — SENNOSIDES-DOCUSATE SODIUM 8.6-50 MG PO TABS: 2.0000 | ORAL_TABLET | ORAL | Status: DC

## 2020-04-18 NOTE — Discharge Summary (Addendum)
Postpartum Discharge Summary   Patient Name: Wendy Harris DOB: 05/05/1981 MRN: [id number]  Date of admission: 04/17/2020 Delivery date:04/18/2020  Delivering provider: Gavin Pound  Date of discharge: 04/19/2020  Admitting diagnosis: Uncontrolled diabetes mellitus ([place]) [E11.65] Intrauterine pregnancy: [redacted]w[redacted]d    Secondary diagnosis:  Principal Problem:   SVD (spontaneous vaginal delivery) Active Problems:   History of vacuum extraction assisted delivery   [place]  Additional problems: gHTN, LGA (99th %)    Discharge diagnosis: Gestational Hypertension                                              Post partum procedures:None Augmentation: N/A Complications: None  Hospital course: Induction of Labor With Vaginal Delivery   39y.o. yo [id number] admitted to the hospital 04/17/2020 for induction of labor.  Indication for induction: Gestational hypertension. Patient pregnancy also complicated by GDM-A1. Patient had an uncomplicated labor course as follows: Patient had outpatient foley bulb placed and received Pitocin and AROM upon arrival to the hospital.  Patient had one prolonged decel resulting in reduction of pitocin to half.  Patient received and epidural and progressed to complete. She delivered without incident.    Membrane Rupture Time/Date: 5:41 PM ,04/17/2020   Delivery Method:Vaginal, Spontaneous  Episiotomy: None  Lacerations:  None  Details of delivery can be found in separate delivery note.  Patient had a routine postpartum course except for several postpartum blood pressures elevated to 140s/90s. Patient is discharged home 04/19/20. Procardia 306maily was prescribed at discharge.  Newborn Data: Birth date:04/18/2020  Birth time:1:51 AM  Gender:Female  Living status:Living  Apgars:7 ,10  Weight:8 lb (3.629 kg)   Magnesium Sulfate received: No BMZ received: No Rhophylac:No MMR:No T-DaP:Given prenatally 03/03/20 Flu:  No Transfusion:No  Physical exam  Vitals:   04/18/20 1243 04/18/20 1605 04/18/20 2113 04/19/20 0531  BP: 125/72 134/79 137/88 122/72  Pulse:  100 (!) 102 92  Resp: _0 Temp: 98.1 F (36.7 C) 98.1 F (36.7 C) 98 F (36.7 C) 97.8 F (36.6 C)  TempSrc: Oral Oral Oral Oral  SpO2:      Weight:      Height:       General: alert, cooperative and no distress Lochia: appropriate Uterine Fundus: firm Incision: N/A DVT Evaluation: No evidence of DVT seen on physical exam. Labs: Lab Results  Component Value Date   WBC 12.0 (H) 04/17/2020   HGB 10.8 (L) 04/17/2020   HCT 33.4 (L) 04/17/2020   MCV 91.0 04/17/2020   PLT 381 04/17/2020   CMP Latest Ref Rng & Units 04/19/2020  Glucose 70 - 99 mg/dL 113(H)  BUN 6 - 20 mg/dL -  Creatinine 0.44 - 1.00 mg/dL -  Sodium 135 - 145 mmol/L -  Potassium 3.5 - 5.1 mmol/L -  Chloride 98 - 111 mmol/L -  CO2 22 - 32 mmol/L -  Calcium 8.9 - 10.3 mg/dL -  Total Protein 6.5 - 8.1 g/dL -  Total Bilirubin 0.3 - 1.2 mg/dL -  Alkaline Phos 38 - 126 U/L -  AST 15 - 41 U/L -  ALT 0 - 44 U/L -   Edinburgh Score: Edinburgh Postnatal Depression Scale Screening Tool 04/18/2020  I have been able to laugh and see the funny side of things. 0  I have looked forward with  enjoyment to things. 0  I have blamed myself unnecessarily when things went wrong. 0  I have been anxious or worried for no good reason. 0  I have felt scared or panicky for no good reason. 0  Things have been getting on top of me. 0  I have been so unhappy that I have had difficulty sleeping. 0  I have felt sad or miserable. 0  I have been so unhappy that I have been crying. 0  The thought of harming myself has occurred to me. 0  Edinburgh Postnatal Depression Scale Total 0     After visit meds:  Allergies as of 04/19/2020   No Known Allergies     Medication List    STOP taking these medications   Accu-Chek Guide test strip Generic drug: glucose blood   prenatal vitamin  w/FE, FA 29-1 MG Chew chewable tablet   triamcinolone ointment 0.5 % Commonly known as: KENALOG     TAKE these medications   ibuprofen 600 MG tablet Commonly known as: ADVIL Take 1 tablet (600 mg total) by mouth every 6 (six) hours.   prenatal multivitamin Tabs tablet Take 1 tablet by mouth daily at 12 noon.       Discharge home in stable condition GHTN: Elevated blood pressures to 140s/90s overnight, will prescribe Procardia XR 41m daily at discharge  Infant Feeding: Breast Infant Disposition:home with mother Discharge instruction: per After Visit Summary and Postpartum booklet. Activity: Advance as tolerated. Pelvic rest for 6 weeks.  Diet: routine diet  Future Appointments: Future Appointments  Date Time Provider DRoff 04/26/2020 10:20 AM WCasa AmistadNURSE [facility] 05/20/2020  8:15 AM BVirginia Rochester [facility] 05/20/2020  9:30 AM [facility]  Follow up Visit:  PP message sent 04/18/2020 Please schedule this patient for a In person postpartum visit in 6 weeks with the following provider: Any provider. Additional Postpartum F/U:2 hour GTT and BP check 1 week  High risk pregnancy complicated by: GDM and HTN Delivery mode:  Vaginal, Spontaneous  Anticipated Birth Control:  Abstinence and condoms  04/19/2020 VWende Mott MD (resident)  Attestation of Supervision of Student:  I confirm that I have verified the information documented in the medical student's note and that I have also personally reperformed the history, physical exam and all medical decision making activities.  I have verified that all services and findings are accurately documented in this student's note; and I agree with management and plan as outlined in the documentation. I have also made any necessary editorial changes.  Ms. MBrandihad questions about the medication she was put on (Procardia XR) for blood pressure since her pressures have been stable today since 0500  04/18/20. After discussion (and consultation with Dr. BElgie Congo, she is amenable to a daily bp check using a home or grocery store cuff and her 1wk BP check. She also declined the Nexplanon since she has a history of heavy bleeding while on OCPs (both progesterone only and COCs), she plans to be abstinent or use condoms until her 6wk appointment and will decide at that time which to try. She was concerned about her blood sugar and engaged in a long conversation about using smoking cessation (she did not smoke during the pregnancy, but did pre-pregnancy and initially planned to restart), nutrition and exercise to improve both her blood pressure and glucose.   JGabriel Carina CWhitehavenfor WDe[facility] 04/19/2020 11:29 AM

## 2020-04-18 NOTE — Progress Notes (Signed)
Briane Birden MRN: [id number]  Subjective: -Patient reports continued pressure and desire to push.   Objective: BP 138/76   Pulse (!) 114   Temp 99.8 F (37.7 C) (Axillary)   Resp 18   Ht 5\' 8"  (1.727 m)   Wt 115.7 kg   LMP  (LMP Unknown)   SpO2 100%   BMI 38.78 kg/m  No intake/output data recorded. No intake/output data recorded.  Fetal Monitoring: FHT: 145 bpm, Mod Var, -Decels, +Accels UC: Q59min    Vaginal Exam: SVE:   Dilation: 10 Effacement (%): 100 Station: Plus 1 Exam by:: [email or web address] Membranes: AROM x 6 hrs Internal Monitors: FSE, IUPC  Augmentation/Induction: Pitocin:17mUn/min Cytotec: None  Assessment:  IUP at 37.1 weeks Cat I FT  IOl s/t gHTN Low grade Temp BS 124  Plan: -Nurse instructed to give tylenol for low grade fever. -FHR remains reassuring. -Cervical exam shows complete dilation -Patient tearful when told she can now start pushing. -Will remain at bedside as appropriate.   11m, CNM Advanced Practice Provider, [facility] 04/18/2020, 12:44 AM

## 2020-04-18 NOTE — Anesthesia Postprocedure Evaluation (Signed)
Anesthesia Post Note  Patient: Wendy Harris  Procedure(s) Performed: AN AD HOC LABOR EPIDURAL     Patient location during evaluation: Mother Baby Anesthesia Type: Epidural Level of consciousness: awake and alert and oriented Pain management: satisfactory to patient Vital Signs Assessment: post-procedure vital signs reviewed and stable Respiratory status: respiratory function stable Cardiovascular status: stable Postop Assessment: no headache, no backache, epidural receding, patient able to bend at knees, no signs of nausea or vomiting, adequate PO intake and able to ambulate Anesthetic complications: no   No complications documented.  Last Vitals:  Vitals:   04/18/20 0509 04/18/20 0848  BP: (!) 143/84 125/77  Pulse: (!) 128 96  Resp: 16 20  Temp: 36.9 C 36.9 C  SpO2:  100%    Last Pain:  Vitals:   04/18/20 0848  TempSrc: Oral  PainSc:    Pain Goal:                   Kyel Purk

## 2020-04-18 NOTE — Progress Notes (Signed)
Patient request RN to check glucose.

## 2020-04-18 NOTE — Progress Notes (Deleted)
Mother asking about her bathing infant,  RN advised she wait until we have wnl glucose.

## 2020-04-18 NOTE — Progress Notes (Signed)
POSTPARTUM PROGRESS NOTE  Subjective: Annabeth Tortora is a 39 y.o. [id number] s/p vaginal delivery at [redacted]w[redacted]d. She reports she is doing well this morning. No HA, vision changes, chest pain or shortness of breath. No acute events overnight. She denies any problems with ambulating, voiding or po intake. Denies nausea or vomiting. She has passed flatus. Pain is well controlled.  Lochia is moderate without clots.  Objective: Blood pressure 125/72, pulse 96, temperature 98.1 F (36.7 C), temperature source Oral, resp. rate 18, height 5\' 8"  (1.727 m), weight 115.7 kg, SpO2 100 %, unknown if currently breastfeeding.  Physical Exam:  General: alert, cooperative and no distress Chest: no respiratory distress Abdomen: soft, non-tender  Uterine Fundus: firm and at level of umbilicus Extremities: No calf swelling or tenderness  trace LE edema bilaterally  Recent Labs    04/17/20 0812 04/17/20 1821  HGB 11.4* 10.8*  HCT 34.5* 33.4*    Assessment/Plan: Vickee Mormino is a 39 y.o. 24 s/p VD at [redacted]w[redacted]d s/p IOL for gHTN. Pregnancy also complicated by A2GDM on MTF.  Routine Postpartum Care: Doing well, pain well-controlled.  -- Continue routine care, lactation support  -- Contraception: undecided (considering outpatient Nexplanon) -- Feeding: Both  A2GDM: Pt on MTF in pregnancy. Pt with random BG 246 s/p 2 regular Cokes. Repeat BG 113 per nursing. -- plan to repeat FBG on PPD#2 given no true fasting level today  GHTN: Reassuring HELLP labs on admission. Postpartum BPs normal to mild range w/o medications. No concerning signs/symptoms. - plan for 1 week BP check in clinic  Dispo: Plan for discharge on PPD#2.  [redacted]w[redacted]d, MD OB Fellow, Faculty Practice 04/18/2020 1:23 PM

## 2020-04-19 LAB — GLUCOSE, RANDOM: Glucose, Bld: 113 mg/dL — ABNORMAL HIGH (ref 70–99)

## 2020-04-19 LAB — GLUCOSE, CAPILLARY
Glucose-Capillary: 113 mg/dL — ABNORMAL HIGH (ref 70–99)
Glucose-Capillary: 105 mg/dL — ABNORMAL HIGH (ref 70–99)

## 2020-04-19 MED ORDER — SIMETHICONE 80 MG PO CHEW: 80.0000 mg | CHEWABLE_TABLET | ORAL | Status: DC | PRN

## 2020-04-19 MED ORDER — ACETAMINOPHEN 325 MG PO TABS: 650.0000 mg | ORAL_TABLET | ORAL | 0 refills | Status: AC | PRN

## 2020-04-19 MED ORDER — LIDOCAINE HCL 1 % IJ SOLN
0.0000 mL | Freq: Once | INTRAMUSCULAR | Status: DC | PRN
  Filled 2020-04-19: qty 20

## 2020-04-19 MED ORDER — IBUPROFEN 600 MG PO TABS: 600.0000 mg | ORAL_TABLET | Freq: Four times a day (QID) | ORAL | 0 refills | Status: DC

## 2020-04-19 MED ORDER — DIBUCAINE (PERIANAL) 1 % EX OINT: 1.0000 "application " | TOPICAL_OINTMENT | CUTANEOUS | Status: DC | PRN

## 2020-04-19 MED ORDER — ONDANSETRON HCL 4 MG PO TABS: 4.0000 mg | ORAL_TABLET | ORAL | Status: DC | PRN

## 2020-04-19 MED ORDER — ETONOGESTREL 68 MG ~~LOC~~ IMPL
68.0000 mg | DRUG_IMPLANT | Freq: Once | SUBCUTANEOUS | Status: DC
  Filled 2020-04-19: qty 1

## 2020-04-19 MED ORDER — DIPHENHYDRAMINE HCL 25 MG PO CAPS: 25.0000 mg | ORAL_CAPSULE | Freq: Four times a day (QID) | ORAL | Status: DC | PRN

## 2020-04-19 MED ORDER — NIFEDIPINE ER 30 MG PO TB24: 30.0000 mg | ORAL_TABLET | Freq: Every day | ORAL | 1 refills | Status: DC

## 2020-04-19 MED ORDER — ACETAMINOPHEN 325 MG PO TABS: 650.0000 mg | ORAL_TABLET | ORAL | Status: DC | PRN

## 2020-04-19 MED ORDER — WITCH HAZEL-GLYCERIN EX PADS: 1.0000 "application " | MEDICATED_PAD | CUTANEOUS | Status: DC | PRN

## 2020-04-19 MED ORDER — COCONUT OIL OIL: 1.0000 "application " | TOPICAL_OIL | Status: DC | PRN

## 2020-04-19 MED ORDER — NIFEDIPINE ER OSMOTIC RELEASE 30 MG PO TB24
30.0000 mg | ORAL_TABLET | Freq: Every day | ORAL | Status: DC
  Filled 2020-04-19 (×2): qty 1

## 2020-04-19 MED ORDER — PRENATAL MULTIVITAMIN CH: 1.0000 | ORAL_TABLET | Freq: Every day | ORAL | 3 refills | Status: DC

## 2020-04-19 MED ORDER — IBUPROFEN 600 MG PO TABS: 600.0000 mg | ORAL_TABLET | Freq: Four times a day (QID) | ORAL | 0 refills | Status: AC

## 2020-04-19 MED ORDER — ACETAMINOPHEN 325 MG PO TABS: 650.0000 mg | ORAL_TABLET | ORAL | 0 refills | Status: DC | PRN

## 2020-04-19 MED ORDER — BENZOCAINE-MENTHOL 20-0.5 % EX AERO: 1.0000 "application " | INHALATION_SPRAY | CUTANEOUS | Status: DC | PRN

## 2020-04-19 MED ORDER — SENNOSIDES-DOCUSATE SODIUM 8.6-50 MG PO TABS: 2.0000 | ORAL_TABLET | ORAL | Status: DC

## 2020-04-19 MED ORDER — ONDANSETRON HCL 4 MG/2ML IJ SOLN: 4.0000 mg | INTRAMUSCULAR | Status: DC | PRN

## 2020-04-19 MED FILL — IBUPROFEN 600 MG TABLET: 600 | 7 days supply | Qty: 30 | Fill #0

## 2020-04-19 MED FILL — NIFEdipine ER 30 MG TB24: 30 | 30 days supply | Qty: 30 | Fill #0

## 2020-04-19 NOTE — Progress Notes (Signed)
POSTPARTUM PROGRESS NOTE  Subjective: Wendy Harris is a 39 y.o. [id number] s/p VD at [redacted]w[redacted]d.  She reports she doing well. No acute events overnight. She denies any problems with ambulating, voiding or po intake. Denies nausea or vomiting. She has passed flatus. Pain is well controlled.  Lochia is appropriate. Called to patient room as the patient is frustrated at having to take medications (Procardia) that she feels are not necessary. She was counseled about the need for blood pressure management and states that she will take the medication.  Objective: Blood pressure 122/72, pulse 92, temperature 97.8 F (36.6 C), temperature source Oral, resp. rate 18, height 5\' 8"  (1.727 m), weight 115.7 kg, SpO2 100 %, unknown if currently breastfeeding.  Physical Exam:  General: alert, frustrated and minimally tearful Chest: no respiratory distress Abdomen: soft, non-tender  Uterine Fundus: firm, appropriately tender Extremities: No calf swelling or tenderness  no edema  Recent Labs    04/17/20 0812 04/17/20 1821  HGB 11.4* 10.8*  HCT 34.5* 33.4*    Assessment/Plan: Wendy Harris is a 39 y.o. 24 s/p VD at [redacted]w[redacted]d for GDMA2 and gHTN.  Routine Postpartum Care: Doing well, pain well-controlled.  -- Continue routine care, lactation support  -- Contraception: Out-patient Nexplanon -- Feeding: Breast   #GDMA2: CBGs: 113>105. #gHTN: Several elevated BP readings since delivery. Highest BP in last 24h is 137/88. Started Procardia 30mg , continue monitoring pressures.  Dispo: Plan for discharge tomorrow.  [redacted]w[redacted]d, DO PGY1 Family Medicine Resident

## 2020-04-19 NOTE — Discharge Instructions (Signed)
Healthy Eating Following a healthy eating pattern may help you to achieve and maintain a healthy body weight, reduce the risk of chronic disease, and live a long and productive life. It is important to follow a healthy eating pattern at an appropriate calorie level for your body. Your nutritional needs should be met primarily through food by choosing a variety of nutrient-rich foods. What are tips for following this plan? Reading food labels  Read labels and choose the following: ? Reduced or low sodium. ? Juices with 100% fruit juice. ? Foods with low saturated fats and high polyunsaturated and monounsaturated fats. ? Foods with whole grains, such as whole wheat, cracked wheat, brown rice, and wild rice. ? Whole grains that are fortified with folic acid. This is recommended for women who are pregnant or who want to become pregnant.  Read labels and avoid the following: ? Foods with a lot of added sugars. These include foods that contain brown sugar, corn sweetener, corn syrup, dextrose, fructose, glucose, high-fructose corn syrup, honey, invert sugar, lactose, malt syrup, maltose, molasses, raw sugar, sucrose, trehalose, or turbinado sugar.  Do not eat more than the following amounts of added sugar per day:  6 teaspoons (25 g) for women.  9 teaspoons (38 g) for men. ? Foods that contain processed or refined starches and grains. ? Refined grain products, such as white flour, degermed cornmeal, white bread, and white rice. Shopping  Choose nutrient-rich snacks, such as vegetables, whole fruits, and nuts. Avoid high-calorie and high-sugar snacks, such as potato chips, fruit snacks, and candy.  Use oil-based dressings and spreads on foods instead of solid fats such as butter, stick margarine, or cream cheese.  Limit pre-made sauces, mixes, and "instant" products such as flavored rice, instant noodles, and ready-made pasta.  Try more plant-protein sources, such as tofu, tempeh, black beans,  edamame, lentils, nuts, and seeds.  Explore eating plans such as the Mediterranean diet or vegetarian diet. Cooking  Use oil to saut or stir-fry foods instead of solid fats such as butter, stick margarine, or lard.  Try baking, boiling, grilling, or broiling instead of frying.  Remove the fatty part of meats before cooking.  Steam vegetables in water or broth. Meal planning   At meals, imagine dividing your plate into fourths: ? One-half of your plate is fruits and vegetables. ? One-fourth of your plate is whole grains. ? One-fourth of your plate is protein, especially lean meats, poultry, eggs, tofu, beans, or nuts.  Include low-fat dairy as part of your daily diet. Lifestyle  Choose healthy options in all settings, including home, work, school, restaurants, or stores.  Prepare your food safely: ? Wash your hands after handling raw meats. ? Keep food preparation surfaces clean by regularly washing with hot, soapy water. ? Keep raw meats separate from ready-to-eat foods, such as fruits and vegetables. ? Cook seafood, meat, poultry, and eggs to the recommended internal temperature. ? Store foods at safe temperatures. In general:  Keep cold foods at 59F (4.4C) or below.  Keep hot foods at 159F (60C) or above.  Keep your freezer at [facility] (-17.8C) or below.  Foods are no longer safe to eat when they have been between the temperatures of 40-159F (4.4-60C) for more than 2 hours. What foods should I eat? Fruits Aim to eat 2 cup-equivalents of fresh, canned (in natural juice), or frozen fruits each day. Examples of 1 cup-equivalent of fruit include 1 small apple, 8 large strawberries, 1 cup canned fruit,  cup  dried fruit, or 1 cup 100% juice. Vegetables Aim to eat 2-3 cup-equivalents of fresh and frozen vegetables each day, including different varieties and colors. Examples of 1 cup-equivalent of vegetables include 2 medium carrots, 2 cups raw, leafy greens, 1 cup chopped  vegetable (raw or cooked), or 1 medium baked potato. Grains Aim to eat 6 ounce-equivalents of whole grains each day. Examples of 1 ounce-equivalent of grains include 1 slice of bread, 1 cup ready-to-eat cereal, 3 cups popcorn, or  cup cooked rice, pasta, or cereal. Meats and other proteins Aim to eat 5-6 ounce-equivalents of protein each day. Examples of 1 ounce-equivalent of protein include 1 egg, 1/2 cup nuts or seeds, or 1 tablespoon (16 g) peanut butter. A cut of meat or fish that is the size of a deck of cards is about 3-4 ounce-equivalents.  Of the protein you eat each week, try to have at least 8 ounces come from seafood. This includes salmon, trout, herring, and anchovies. Dairy Aim to eat 3 cup-equivalents of fat-free or low-fat dairy each day. Examples of 1 cup-equivalent of dairy include 1 cup (240 mL) milk, 8 ounces (250 g) yogurt, 1 ounces (44 g) natural cheese, or 1 cup (240 mL) fortified soy milk. Fats and oils  Aim for about 5 teaspoons (21 g) per day. Choose monounsaturated fats, such as canola and olive oils, avocados, peanut butter, and most nuts, or polyunsaturated fats, such as sunflower, corn, and soybean oils, walnuts, pine nuts, sesame seeds, sunflower seeds, and flaxseed. Beverages  Aim for six 8-oz glasses of water per day. Limit coffee to three to five 8-oz cups per day.  Limit caffeinated beverages that have added calories, such as soda and energy drinks.  Limit alcohol intake to no more than 1 drink a day for nonpregnant women and 2 drinks a day for men. One drink equals 12 oz of beer (355 mL), 5 oz of wine (148 mL), or 1 oz of hard liquor (44 mL). Seasoning and other foods  Avoid adding excess amounts of salt to your foods. Try flavoring foods with herbs and spices instead of salt.  Avoid adding sugar to foods.  Try using oil-based dressings, sauces, and spreads instead of solid fats. This information is based on general [place] nutrition guidelines. For more  information, visit [employer]. Exact amounts may vary based on your nutrition needs. Summary  A healthy eating plan may help you to maintain a healthy weight, reduce the risk of chronic diseases, and stay active throughout your life.  Plan your meals. Make sure you eat the right portions of a variety of nutrient-rich foods.  Try baking, boiling, grilling, or broiling instead of frying.  Choose healthy options in all settings, including home, work, school, restaurants, or stores. This information is not intended to replace advice given to you by your health care provider. Make sure you discuss any questions you have with your health care provider. Document Revised: 12/10/2017 Document Reviewed: 12/10/2017 Elsevier Patient Education  [place]. Vaginal Delivery  Vaginal delivery means that you give birth by pushing your baby out of your birth canal (vagina). A team of health care providers will help you before, during, and after vaginal delivery. Birth experiences are unique for every woman and every pregnancy, and birth experiences vary depending on where you choose to give birth. What happens when I arrive at the birth center or hospital? Once you are in labor and have been admitted into the hospital or birth center, your health care  provider may:  Review your pregnancy history and any concerns that you have.  Insert an IV into one of your veins. This may be used to give you fluids and medicines.  Check your blood pressure, pulse, temperature, and heart rate (vital signs).  Check whether your bag of water (amniotic sac) has broken (ruptured).  Talk with you about your birth plan and discuss pain control options. Monitoring Your health care provider may monitor your contractions (uterine monitoring) and your baby's heart rate (fetal monitoring). You may need to be monitored:  Often, but not continuously (intermittently).  All the time or for long periods at a time  (continuously). Continuous monitoring may be needed if: ? You are taking certain medicines, such as medicine to relieve pain or make your contractions stronger. ? You have pregnancy or labor complications. Monitoring may be done by:  Placing a special stethoscope or a handheld monitoring device on your abdomen to check your baby's heartbeat and to check for contractions.  Placing monitors on your abdomen (external monitors) to record your baby's heartbeat and the frequency and length of contractions.  Placing monitors inside your uterus through your vagina (internal monitors) to record your baby's heartbeat and the frequency, length, and strength of your contractions. Depending on the type of monitor, it may remain in your uterus or on your baby's head until birth.  Telemetry. This is a type of continuous monitoring that can be done with external or internal monitors. Instead of having to stay in bed, you are able to move around during telemetry. Physical exam Your health care provider may perform frequent physical exams. This may include:  Checking how and where your baby is positioned in your uterus.  Checking your cervix to determine: ? Whether it is thinning out (effacing). ? Whether it is opening up (dilating). What happens during labor and delivery?  Normal labor and delivery is divided into the following three stages: Stage 1  This is the longest stage of labor.  This stage can last for hours or days.  Throughout this stage, you will feel contractions. Contractions generally feel mild, infrequent, and irregular at first. They get stronger, more frequent (about every 2-3 minutes), and more regular as you move through this stage.  This stage ends when your cervix is completely dilated to 4 inches (10 cm) and completely effaced. Stage 2  This stage starts once your cervix is completely effaced and dilated and lasts until the delivery of your baby.  This stage may last from 20  minutes to 2 hours.  This is the stage where you will feel an urge to push your baby out of your vagina.  You may feel stretching and burning pain, especially when the widest part of your baby's head passes through the vaginal opening (crowning).  Once your baby is delivered, the umbilical cord will be clamped and cut. This usually occurs after waiting a period of 1-2 minutes after delivery.  Your baby will be placed on your bare chest (skin-to-skin contact) in an upright position and covered with a warm blanket. Watch your baby for feeding cues, like rooting or sucking, and help the baby to your breast for his or her first feeding. Stage 3  This stage starts immediately after the birth of your baby and ends after you deliver the placenta.  This stage may take anywhere from 5 to 30 minutes.  After your baby has been delivered, you will feel contractions as your body expels the placenta and your  uterus contracts to control bleeding. What can I expect after labor and delivery?  After labor is over, you and your baby will be monitored closely until you are ready to go home to ensure that you are both healthy. Your health care team will teach you how to care for yourself and your baby.  You and your baby will stay in the same room (rooming in) during your hospital stay. This will encourage early bonding and successful breastfeeding.  You may continue to receive fluids and medicines through an IV.  Your uterus will be checked and massaged regularly (fundal massage).  You will have some soreness and pain in your abdomen, vagina, and the area of skin between your vaginal opening and your anus (perineum).  If an incision was made near your vagina (episiotomy) or if you had some vaginal tearing during delivery, cold compresses may be placed on your episiotomy or your tear. This helps to reduce pain and swelling.  You may be given a squirt bottle to use instead of wiping when you go to the  bathroom. To use the squirt bottle, follow these steps: ? Before you urinate, fill the squirt bottle with warm water. Do not use hot water. ? After you urinate, while you are sitting on the toilet, use the squirt bottle to rinse the area around your urethra and vaginal opening. This rinses away any urine and blood. ? Fill the squirt bottle with clean water every time you use the bathroom.  It is normal to have vaginal bleeding after delivery. Wear a sanitary pad for vaginal bleeding and discharge. Summary  Vaginal delivery means that you will give birth by pushing your baby out of your birth canal (vagina).  Your health care provider may monitor your contractions (uterine monitoring) and your baby's heart rate (fetal monitoring).  Your health care provider may perform a physical exam.  Normal labor and delivery is divided into three stages.  After labor is over, you and your baby will be monitored closely until you are ready to go home. This information is not intended to replace advice given to you by your health care provider. Make sure you discuss any questions you have with your health care provider. Document Revised: 10/02/2017 Document Reviewed: 10/02/2017 Elsevier Patient Education  2020 [employer].

## 2020-04-21 ENCOUNTER — Encounter: Payer: Medicaid Other | Admitting: Obstetrics and Gynecology

## 2020-04-21 ENCOUNTER — Other Ambulatory Visit: Payer: Medicaid Other

## 2020-04-26 ENCOUNTER — Encounter: Payer: Self-pay | Admitting: *Deleted

## 2020-04-26 ENCOUNTER — Other Ambulatory Visit: Payer: Self-pay

## 2020-04-26 ENCOUNTER — Ambulatory Visit (INDEPENDENT_AMBULATORY_CARE_PROVIDER_SITE_OTHER): Payer: Medicaid Other | Admitting: *Deleted

## 2020-04-26 VITALS — BP 154/90 | HR 69 | Ht 68.0 in | Wt 243.5 lb

## 2020-04-26 DIAGNOSIS — Z0130 Encounter for examination of blood pressure without abnormal findings: Secondary | ICD-10-CM

## 2020-04-26 MED ORDER — NIFEDIPINE ER OSMOTIC RELEASE 30 MG PO TB24
30.0000 mg | ORAL_TABLET | Freq: Every day | ORAL | 1 refills | Status: DC
Start: 2020-04-26 — End: 2021-06-20

## 2020-04-26 NOTE — Progress Notes (Signed)
Patient seen and assessed by nursing staff.  Agree with documentation and plan.  

## 2020-04-26 NOTE — Progress Notes (Signed)
Pt presents for BP check following vaginal delivery on 04/17/20. BP - 151/86, P - 73. She denies H/A or visual disturbances. BP re-check after 10 minutes = BP - 154/90, P - .  Consult w/Dr. Shawnie Pons and Nifedipine was prescribed. Pt was advised of the importance of taking this medication daily and that more than likely she would only need it on a temporary basis. Pt voiced understanding. She has scheduled PP visit w/2hr GTT on 9/7.

## 2020-04-28 ENCOUNTER — Encounter: Payer: Medicaid Other | Admitting: Obstetrics and Gynecology

## 2020-04-28 ENCOUNTER — Other Ambulatory Visit: Payer: Medicaid Other

## 2020-05-05 ENCOUNTER — Other Ambulatory Visit: Payer: Medicaid Other

## 2020-05-05 ENCOUNTER — Encounter: Payer: Medicaid Other | Admitting: Family Medicine

## 2020-05-12 ENCOUNTER — Encounter: Payer: Medicaid Other | Admitting: Obstetrics & Gynecology

## 2020-05-12 ENCOUNTER — Other Ambulatory Visit: Payer: Medicaid Other

## 2020-05-20 ENCOUNTER — Other Ambulatory Visit: Payer: Medicaid Other

## 2020-05-20 ENCOUNTER — Encounter: Payer: Self-pay | Admitting: General Practice

## 2020-05-20 ENCOUNTER — Ambulatory Visit: Payer: Medicaid Other | Admitting: Nurse Practitioner

## 2020-05-20 ENCOUNTER — Other Ambulatory Visit: Payer: Self-pay

## 2021-05-21 ENCOUNTER — Encounter (HOSPITAL_COMMUNITY): Payer: Self-pay | Admitting: Emergency Medicine

## 2021-05-21 ENCOUNTER — Other Ambulatory Visit: Payer: Self-pay

## 2021-05-21 ENCOUNTER — Emergency Department (HOSPITAL_COMMUNITY): Payer: Medicaid Other

## 2021-05-21 ENCOUNTER — Emergency Department (HOSPITAL_COMMUNITY)
Admission: EM | Admit: 2021-05-21 | Discharge: 2021-05-22 | Disposition: A | Discharge status: Home / Self Care | Payer: Medicaid Other | Attending: Emergency Medicine | Admitting: Emergency Medicine

## 2021-05-21 DIAGNOSIS — O0289 Other abnormal products of conception: Secondary | ICD-10-CM | POA: Diagnosis not present

## 2021-05-21 DIAGNOSIS — E119 Type 2 diabetes mellitus without complications: Secondary | ICD-10-CM | POA: Insufficient documentation

## 2021-05-21 DIAGNOSIS — R509 Fever, unspecified: Secondary | ICD-10-CM | POA: Diagnosis present

## 2021-05-21 DIAGNOSIS — J189 Pneumonia, unspecified organism: Secondary | ICD-10-CM

## 2021-05-21 DIAGNOSIS — Z87891 Personal history of nicotine dependence: Secondary | ICD-10-CM | POA: Diagnosis not present

## 2021-05-21 DIAGNOSIS — J181 Lobar pneumonia, unspecified organism: Secondary | ICD-10-CM | POA: Diagnosis not present

## 2021-05-21 DIAGNOSIS — Z20822 Contact with and (suspected) exposure to covid-19: Secondary | ICD-10-CM | POA: Diagnosis not present

## 2021-05-21 LAB — I-STAT CHEM 8, ED
BUN: 6 mg/dL (ref 6–20)
Calcium, Ion: 1.13 mmol/L — ABNORMAL LOW (ref 1.15–1.40)
Chloride: 97 mmol/L — ABNORMAL LOW (ref 98–111)
Creatinine, Ser: 0.7 mg/dL (ref 0.44–1.00)
Glucose, Bld: 108 mg/dL — ABNORMAL HIGH (ref 70–99)
HCT: 38 % (ref 36.0–46.0)
Hemoglobin: 12.9 g/dL (ref 12.0–15.0)
Potassium: 3.1 mmol/L — ABNORMAL LOW (ref 3.5–5.1)
Sodium: 132 mmol/L — ABNORMAL LOW (ref 135–145)
TCO2: 23 mmol/L (ref 22–32)

## 2021-05-21 LAB — COMPREHENSIVE METABOLIC PANEL
ALT: 51 U/L — ABNORMAL HIGH (ref 0–44)
AST: 44 U/L — ABNORMAL HIGH (ref 15–41)
Albumin: 3.5 g/dL (ref 3.5–5.0)
Alkaline Phosphatase: 52 U/L (ref 38–126)
Anion gap: 15 (ref 5–15)
BUN: 9 mg/dL (ref 6–20)
CO2: 23 mmol/L (ref 22–32)
Calcium: 8.9 mg/dL (ref 8.9–10.3)
Chloride: 95 mmol/L — ABNORMAL LOW (ref 98–111)
Creatinine, Ser: 0.88 mg/dL (ref 0.44–1.00)
GFR, Estimated: >60 mL/min (ref >60)
Glucose, Bld: 108 mg/dL — ABNORMAL HIGH (ref 70–99)
Potassium: 3.2 mmol/L — ABNORMAL LOW (ref 3.5–5.1)
Sodium: 133 mmol/L — ABNORMAL LOW (ref 135–145)
Total Bilirubin: 0.7 mg/dL (ref 0.3–1.2)
Total Protein: 8.4 g/dL — ABNORMAL HIGH (ref 6.5–8.1)

## 2021-05-21 LAB — CBC WITH DIFFERENTIAL/PLATELET
Abs Immature Granulocytes: 0.11 10*3/uL — ABNORMAL HIGH (ref 0.00–0.07)
Basophils Absolute: 0.1 10*3/uL (ref 0.0–0.1)
Basophils Relative: 0 %
Eosinophils Absolute: 0 10*3/uL (ref 0.0–0.5)
Eosinophils Relative: 0 %
HCT: 35.9 % — ABNORMAL LOW (ref 36.0–46.0)
Hemoglobin: 12.6 g/dL (ref 12.0–15.0)
Immature Granulocytes: 1 %
Lymphocytes Relative: 10 %
Lymphs Abs: 1.2 10*3/uL (ref 0.7–4.0)
MCH: 31.7 pg (ref 26.0–34.0)
MCHC: 35.1 g/dL (ref 30.0–36.0)
MCV: 90.4 fL (ref 80.0–100.0)
Monocytes Absolute: 1.2 10*3/uL — ABNORMAL HIGH (ref 0.1–1.0)
Monocytes Relative: 11 %
Neutro Abs: 9 10*3/uL — ABNORMAL HIGH (ref 1.7–7.7)
Neutrophils Relative %: 78 %
Platelets: 449 10*3/uL — ABNORMAL HIGH (ref 150–400)
RBC: 3.97 MIL/uL (ref 3.87–5.11)
RDW: 12.4 % (ref 11.5–15.5)
WBC: 11.6 10*3/uL — ABNORMAL HIGH (ref 4.0–10.5)
nRBC: 0 % (ref 0.0–0.2)

## 2021-05-21 LAB — RESP PANEL BY RT-PCR (FLU A&B, COVID) ARPGX2
Influenza A by PCR: NEGATIVE
Influenza B by PCR: NEGATIVE
SARS Coronavirus 2 by RT PCR: NEGATIVE

## 2021-05-21 LAB — TROPONIN I (HIGH SENSITIVITY): Troponin I (High Sensitivity): 4 ng/L (ref <18)

## 2021-05-21 LAB — I-STAT BETA HCG BLOOD, ED (MC, WL, AP ONLY): I-stat hCG, quantitative: <5 m[IU]/mL (ref <5)

## 2021-05-21 MED ORDER — SODIUM CHLORIDE 0.9 % IV BOLUS
1000.0000 mL | Freq: Once | INTRAVENOUS | Status: AC
  Administered 2021-05-21: 1000 mL via INTRAVENOUS

## 2021-05-21 MED ORDER — ACETAMINOPHEN 500 MG PO TABS
1000.0000 mg | ORAL_TABLET | Freq: Once | ORAL | Status: AC
  Administered 2021-05-21: 1000 mg via ORAL
  Filled 2021-05-21: qty 2

## 2021-05-21 MED ORDER — LEVOFLOXACIN 500 MG PO TABS: 500.0000 mg | ORAL_TABLET | Freq: Every day | ORAL | 0 refills | Status: AC

## 2021-05-21 MED ORDER — LEVOFLOXACIN IN D5W 750 MG/150ML IV SOLN
750.0000 mg | Freq: Once | INTRAVENOUS | Status: AC
Start: 2021-05-21 — End: 2021-05-22
  Administered 2021-05-21: 750 mg via INTRAVENOUS
  Filled 2021-05-21: qty 150

## 2021-05-21 MED ORDER — POTASSIUM CHLORIDE CRYS ER 20 MEQ PO TBCR
40.0000 meq | EXTENDED_RELEASE_TABLET | Freq: Once | ORAL | Status: AC
  Administered 2021-05-21: 40 meq via ORAL
  Filled 2021-05-21: qty 2

## 2021-05-21 NOTE — ED Triage Notes (Signed)
Patient arrives complaining of body aches, fatigue, cough with clear sputum, and diarrhea. Patient states lack of appetite, but no vomiting.

## 2021-05-21 NOTE — ED Notes (Addendum)
Pt. I-stat Chem 8 results potassium 3.1. EDP,Yao,MD made aware.

## 2021-05-21 NOTE — ED Provider Notes (Signed)
[facility]-EMERGENCY DEPT Provider Note   CSN: [id number] Arrival date & time: 05/21/21  2022     History Chief Complaint  Patient presents with   Cough   Generalized Body Aches    Wendy Harris is a 40 y.o. female presenting to the ED with complaints of flu-like symptoms including fevers, chills, myalgias, arthralgias, diarrhea x 5 days and SOB and CP x 2 days. She denies any hemoptysis, dysuria, confusion, lethargy and abdominal pain. Pt has been around 2 sick people who have tested negative for COVID. She had COVID in January which did not require treatment or hospitalization. Pt admits to decreased PO intake since symptoms, but denies N/V.   Cough Associated symptoms: chest pain, chills, fever, myalgias and shortness of breath   Associated symptoms: no headaches, no rhinorrhea and no sore throat       Past Medical History:  Diagnosis Date   Eczema    Gestational diabetes    Diet controlled   History of gestational diabetes mellitus (GDM)    Hypertension    2nd pregnancy , only   Proteinuria    during 2nd baby pregnancy    Patient Active Problem List   Diagnosis Date Noted   SVD (spontaneous vaginal delivery) 04/18/2020   Uncontrolled diabetes mellitus (HCC) 04/17/2020   GBS (group B Streptococcus carrier), +RV culture, currently pregnant 04/16/2020   Gestational diabetes mellitus (GDM) in third trimester controlled on oral hypoglycemic drug 03/17/2020   Excessive weight gain during pregnancy 01/27/2020   Supervision of high risk pregnancy, antepartum 10/14/2019   AMA (advanced maternal age) multigravida 35+ 10/14/2019   History of gestational diabetes mellitus (GDM)    History of vacuum extraction assisted delivery    Proteinuria     Past Surgical History:  Procedure Laterality Date   NO PAST SURGERIES       OB History     Gravida  5   Para  3   Term  3   Preterm      AB  2   Living  3      SAB  1   IAB      Ectopic       Multiple  0   Live Births  3           Family History  Problem Relation Age of Onset   Diabetes Mother     Social History   Tobacco Use   Smoking status: Former    Types: Cigarettes    Quit date: 09/12/2019    Years since quitting: 1.6   Smokeless tobacco: Never  Vaping Use   Vaping Use: Never used  Substance Use Topics   Alcohol use: Not Currently    Comment: socially   Drug use: Never    Home Medications Prior to Admission medications   Medication Sig Start Date End Date Taking? Authorizing Provider  levofloxacin (LEVAQUIN) 500 MG tablet Take 1 tablet (500 mg total) by mouth daily for 7 days. 05/21/21 05/28/21 Yes Carmel Sacramento, MD  acetaminophen (TYLENOL) 325 MG tablet Take 2 tablets (650 mg total) by mouth every 4 (four) hours as needed (for pain scale < 4). Patient not taking: Reported on 04/26/2020 04/19/20   Lilland, Alana, DO  ibuprofen (ADVIL) 600 MG tablet Take 1 tablet (600 mg total) by mouth every 6 (six) hours. 04/19/20   Lilland, Alana, DO  NIFEdipine (PROCARDIA-XL/NIFEDICAL-XL) 30 MG 24 hr tablet Take 1 tablet (30 mg total) by mouth daily. 04/26/20  Reva Bores, MD  prenatal vitamin w/FE, FA (NATACHEW) 29-1 MG CHEW chewable tablet Chew 1 tablet by mouth daily at 12 noon. 03/31/20   Tereso Newcomer, MD    Allergies    Patient has no known allergies.  Review of Systems   Review of Systems  Constitutional:  Positive for chills, fatigue and fever.  HENT:  Negative for rhinorrhea, sinus pain, sneezing and sore throat.   Respiratory:  Positive for cough, chest tightness and shortness of breath.   Cardiovascular:  Positive for chest pain. Negative for palpitations and leg swelling.  Gastrointestinal:  Positive for diarrhea. Negative for abdominal pain.  Genitourinary:  Negative for dysuria.  Musculoskeletal:  Positive for arthralgias and myalgias. Negative for joint swelling and neck pain.  Neurological:  Positive for weakness. Negative for dizziness,  numbness and headaches.   Physical Exam Updated Vital Signs BP (!) 108/54   Pulse 89   Temp 99.3 F (37.4 C) (Oral)   Resp 16   Ht 5\' 8"  (1.727 m)   Wt 113.4 kg   SpO2 100%   BMI 38.01 kg/m   Physical Exam Constitutional:      Appearance: Normal appearance.  HENT:     Head: Normocephalic and atraumatic.  Eyes:     Extraocular Movements: Extraocular movements intact.  Cardiovascular:     Rate and Rhythm: Normal rate and regular rhythm.  Pulmonary:     Effort: Pulmonary effort is normal.     Comments: Diminished breath sounds over LLL  Abdominal:     General: Abdomen is flat.     Palpations: Abdomen is soft.  Musculoskeletal:     Cervical back: Normal range of motion and neck supple.  Skin:    General: Skin is warm and dry.  Neurological:     General: No focal deficit present.     Mental Status: She is alert and oriented to person, place, and time. Mental status is at baseline.  Psychiatric:        Mood and Affect: Mood normal.        Behavior: Behavior normal.    ED Results / Procedures / Treatments   Labs (all labs ordered are listed, but only abnormal results are displayed) Labs Reviewed  COMPREHENSIVE METABOLIC PANEL - Abnormal; Notable for the following components:      Result Value   Sodium 133 (*)    Potassium 3.2 (*)    Chloride 95 (*)    Glucose, Bld 108 (*)    Total Protein 8.4 (*)    AST 44 (*)    ALT 51 (*)    All other components within normal limits  CBC WITH DIFFERENTIAL/PLATELET - Abnormal; Notable for the following components:   WBC 11.6 (*)    HCT 35.9 (*)    Platelets 449 (*)    Neutro Abs 9.0 (*)    Monocytes Absolute 1.2 (*)    Abs Immature Granulocytes 0.11 (*)    All other components within normal limits  I-STAT CHEM 8, ED - Abnormal; Notable for the following components:   Sodium 132 (*)    Potassium 3.1 (*)    Chloride 97 (*)    Glucose, Bld 108 (*)    Calcium, Ion 1.13 (*)    All other components within normal limits  RESP  PANEL BY RT-PCR (FLU A&B, COVID) [id number]  I-STAT BETA HCG BLOOD, ED ([facility], [facility], [facility] ONLY)  TROPONIN I (HIGH SENSITIVITY)    EKG EKG Interpretation  Date/Time:  Saturday May 21 2021 22:35:02 EDT Ventricular Rate:  106 PR Interval:  115 QRS Duration: 84 QT Interval:  335 QTC Calculation: 445 R Axis:   74 Text Interpretation: Sinus tachycardia Right atrial enlargement Minimal ST depression, inferior leads No significant change since last tracing Confirmed by Richardean Canal [phone]) on 05/21/2021 10:41:59 PM  Radiology DG Chest Portable 1 View  Result Date: 05/21/2021 CLINICAL DATA:  Body aches and fatigue EXAM: PORTABLE CHEST 1 VIEW COMPARISON:  None. FINDINGS: Patchy airspace disease at the left lung base concerning for pneumonia. Normal cardiac size. No pneumothorax IMPRESSION: Findings suspicious for left lower lobe pneumonia Electronically Signed   By: Jasmine Pang M.D.   On: 05/21/2021 22:07    Procedures Procedures   Medications Ordered in ED Medications  levofloxacin (LEVAQUIN) IVPB 750 mg (750 mg Intravenous New Bag/Given 05/21/21 2247)  acetaminophen (TYLENOL) tablet 1,000 mg (1,000 mg Oral Given 05/21/21 2247)  sodium chloride 0.9 % bolus 1,000 mL (1,000 mLs Intravenous New Bag/Given 05/21/21 2246)  potassium chloride SA (KLOR-CON) CR tablet 40 mEq (40 mEq Oral Given 05/21/21 2309)    ED Course  I have reviewed the triage vital signs and the nursing notes.  Pertinent labs & imaging results that were available during my care of the patient were reviewed by me and considered in my medical decision making (see chart for details).    MDM Rules/Calculators/A&P                           Pt presenting with flu like symptoms x 5 days and CP and SOB x 2 days. Heart Score 1, low risk for ACS and negative trop. Vitals and labs stable. EKG sinus tachycardia. K 3.1, repleted. Na 133, likely from dehydration, IV fluids given and anticipated to improve with continue PO intake. CXR  consistent with LLL pneumonia. Leviquin started, given it will cover for atypicals and pt is young and low risk for flouroquinolone side effects. COVID negative.   Final Clinical Impression(s) / ED Diagnoses Final diagnoses:  Community acquired pneumonia of left lower lobe of lung    Rx / DC Orders ED Discharge Orders          Ordered    levofloxacin (LEVAQUIN) 500 MG tablet  Daily        05/21/21 2347    levofloxacin (LEVAQUIN) 500 MG tablet  Daily        Pending             Carmel Sacramento, MD 05/22/21 0001    Charlynne Pander, MD 05/22/21 1500

## 2021-05-21 NOTE — ED Provider Notes (Signed)
Emergency Medicine Provider Triage Evaluation Note  Krystie Leiter , a 40 y.o. female  was evaluated in triage.  Pt complains of body aches, fatigue, cough, nausea, and diarrhea.  She is not vaccinated against covid.  She reports chest pain and bad shortness of breath.  No history of cardiac events.  No rash.  No meds since 7am today.  No history of covid prior infection.   Review of Systems  Positive: Chest pain, cough, shortness of breath, body aches, headache, fatigue Negative: syncope  Physical Exam  BP (!) 136/100 (BP Location: Right Arm)   Pulse (!) 103   Temp 99.3 F (37.4 C) (Oral)   Resp 16   Ht 5\' 8"  (1.727 m)   Wt 113.4 kg   SpO2 100%   BMI 38.01 kg/m  Gen:   Awake, appears to feel unwell Resp:  Normal effort  MSK:   Moves extremities without difficulty  Other:  Questionable murmur.   Medical Decision Making  Medically screening exam initiated at 9:36 PM.  Appropriate orders placed.  Harlean Regula was informed that the remainder of the evaluation will be completed by another provider, this initial triage assessment does not replace that evaluation, and the importance of remaining in the ED until their evaluation is complete.  Suspect patient has covid but she has chest pain, shortness of breath, and requires evaluation beyond testing.  Additionally she is unvaccinated.    Marcine Matar 05/21/21 2140    07/21/21, MD 05/21/21 2253

## 2021-05-21 NOTE — Discharge Instructions (Addendum)
Wendy Harris you were admitted for flu-like symptoms and shortness of breath and chest pain. We did lab work for you which show you did not have any heart problems. COVID test is pending but you are outside of your COVID window. X ray shows that you have a pneumonia for which we will prescribe you an antibiotic called levaquin. Please complete this course of antibiotic even if you feel completely fine after a couple of days.

## 2021-06-20 ENCOUNTER — Other Ambulatory Visit: Payer: Self-pay | Admitting: Family Medicine

## 2021-08-12 IMAGING — US US FETAL BPP W/ NON-STRESS
1 series · 13 of 13 positions shown · non-contrast
Comparison: none

[Series 1: us fetal bpp w/ non-stress · 13 acquisitions, 13 frames shown]
[im 1/13]
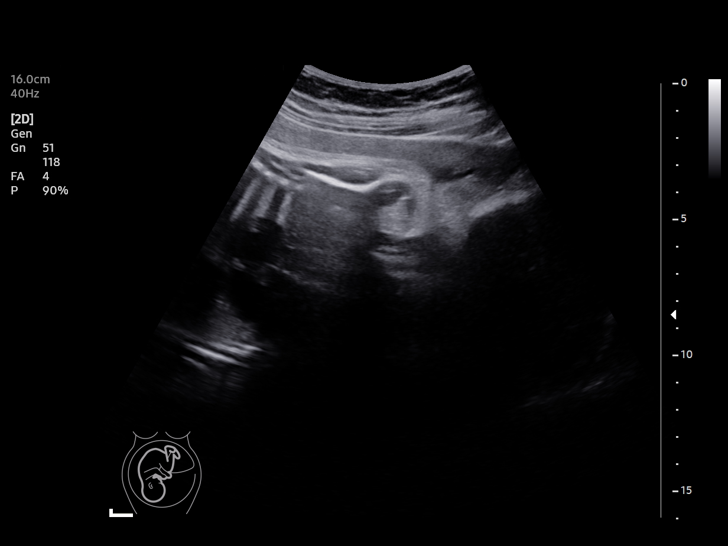
[im 2/13]
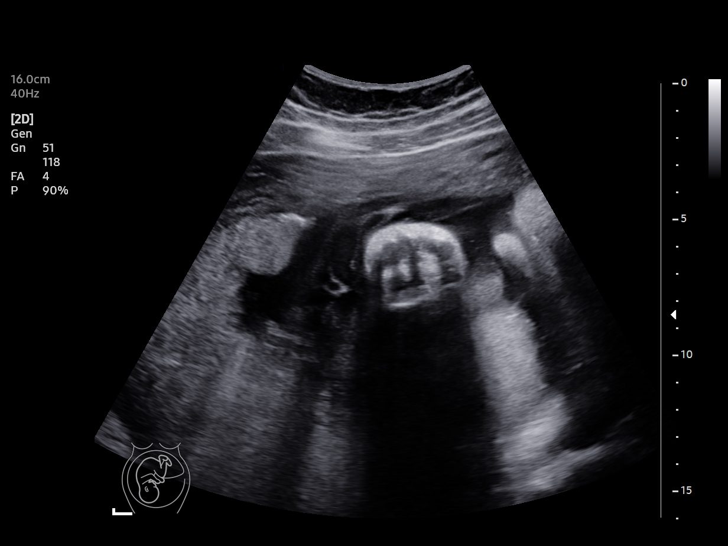
[im 3/13]
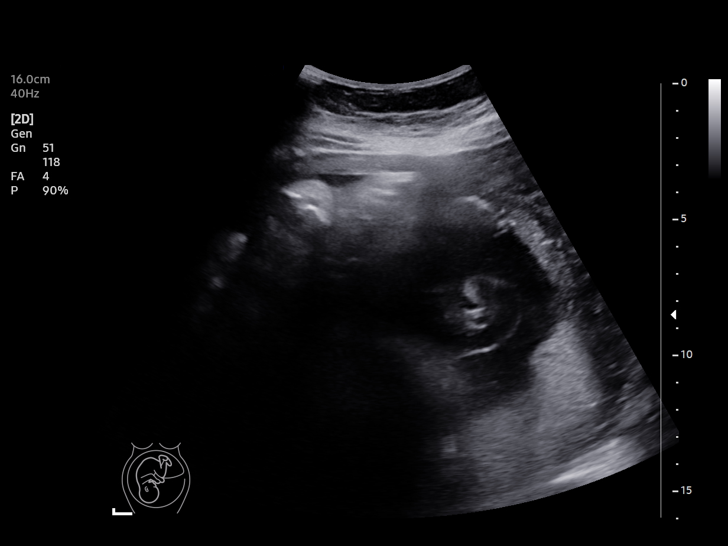
[im 4/13]
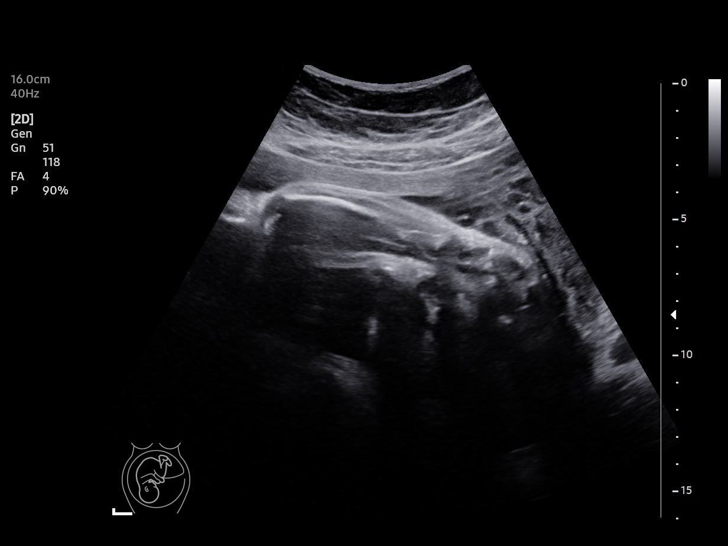
[im 5/13]
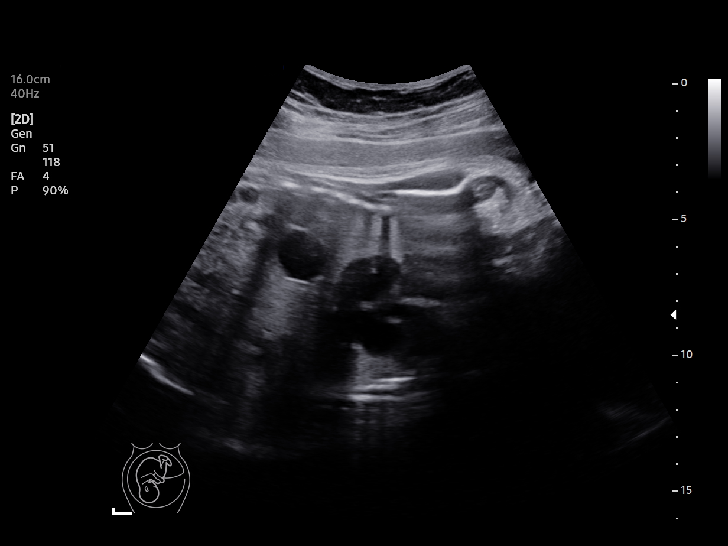
[im 6/13]
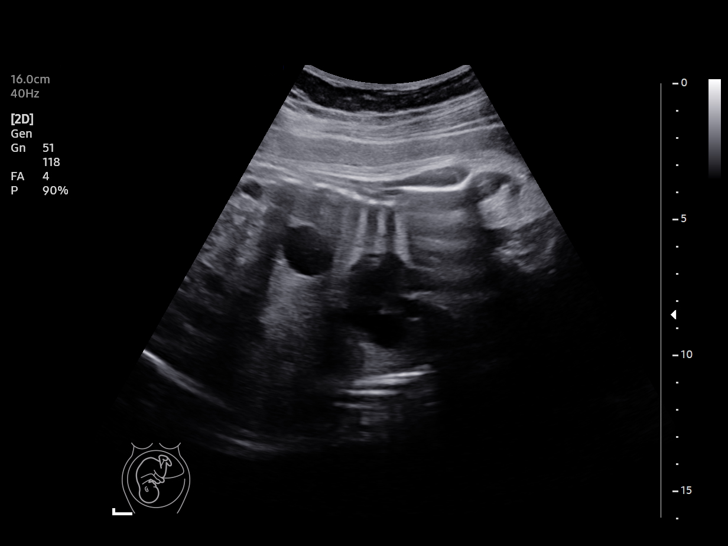
[im 7/13]
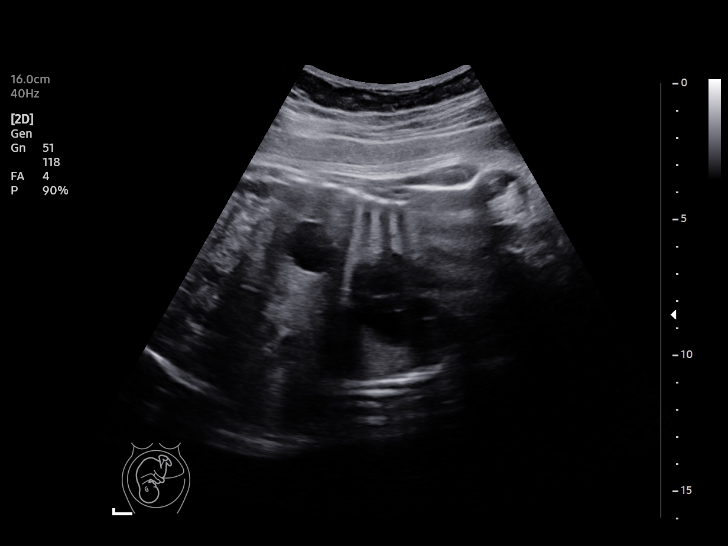
[im 8/13]
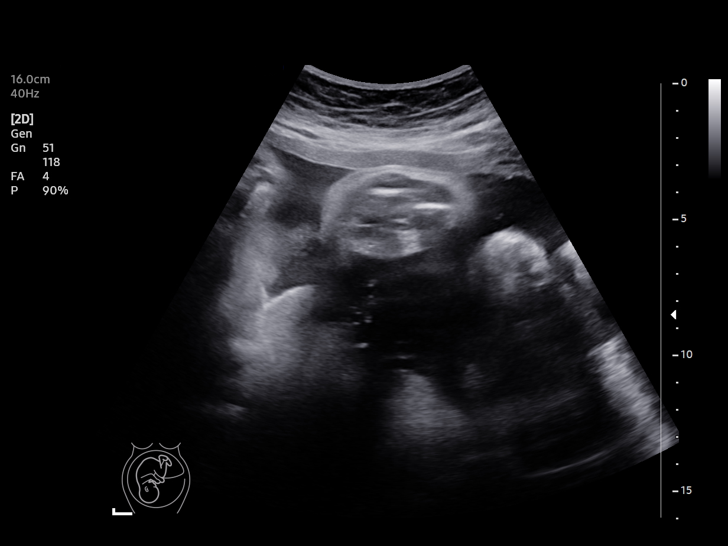
[im 9/13]
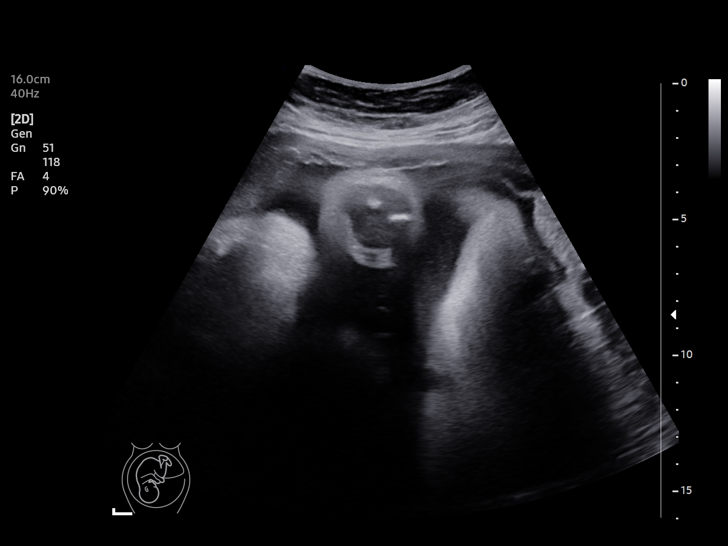
[im 10/13]
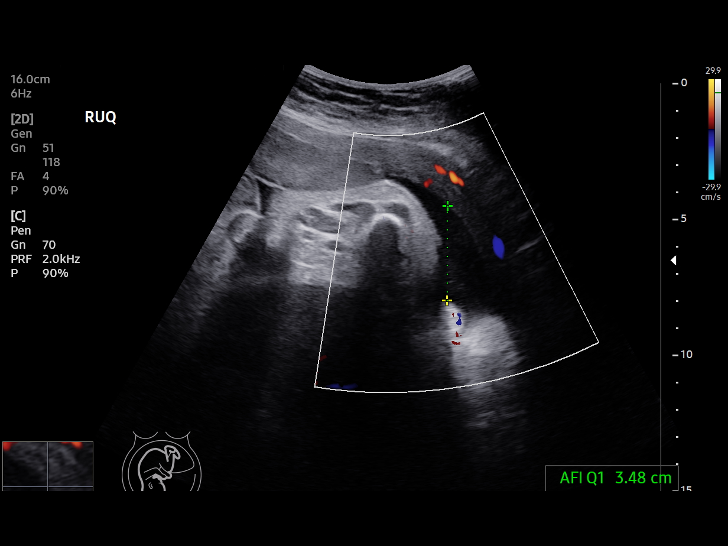
[im 11/13]
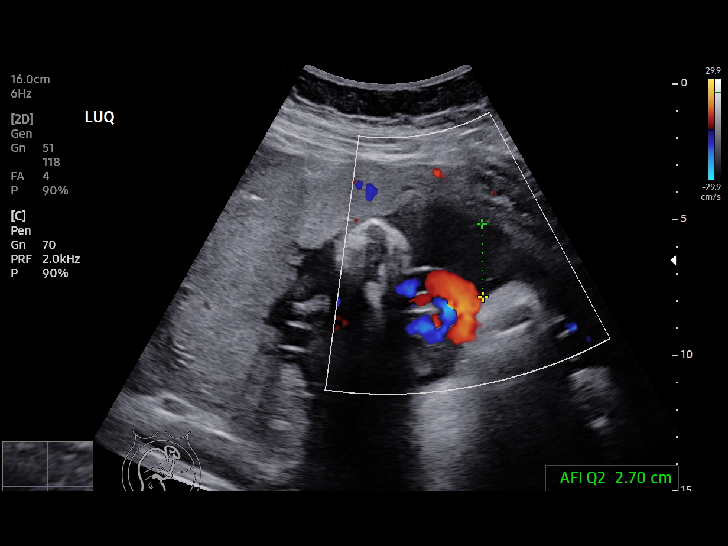
[im 12/13]
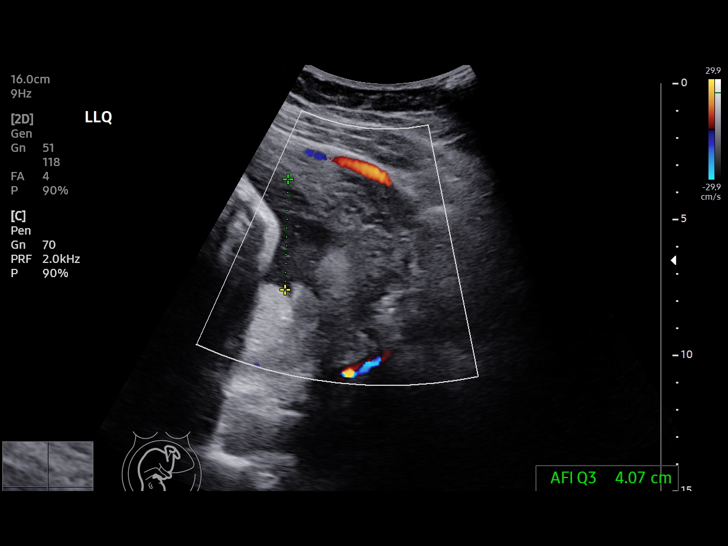
[im 13/13]
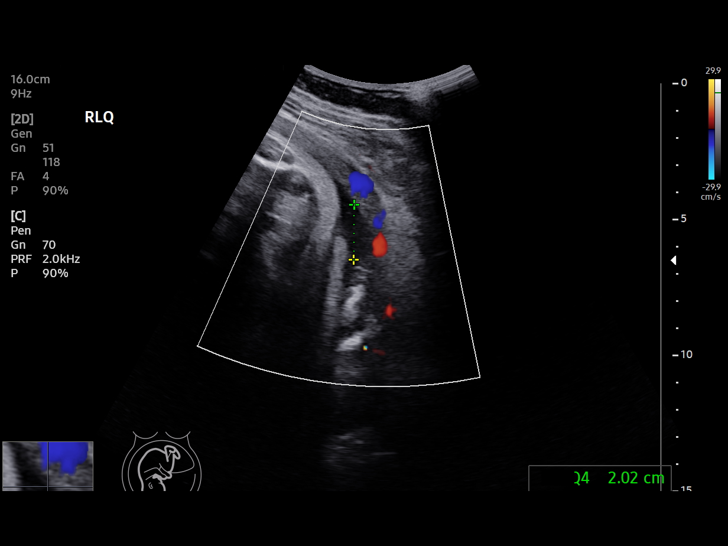

[13 of 13 positions shown; findings below may reference images not displayed]

[REDACTED]care at

 1  US FETAL BPP W/NONSTRESS              76818.4     RAJENTRAN MATUBBER

Service(s) Provided

Indications

 34 weeks gestation of pregnancy
 Gestational diabetes in pregnancy,
 controlled by oral hypoglycemic drugs
 Advanced maternal age multigravida 35+,
 third trimester
Fetal Evaluation

 Num Of Fetuses:         1
 Preg. Location:         Intrauterine
 Cardiac Activity:       Observed
 Presentation:           Cephalic

 Amniotic Fluid
 AFI FV:      Within normal limits

 AFI Sum(cm)     %Tile       Largest Pocket(cm)
 12.3            37

 RUQ(cm)       RLQ(cm)       LUQ(cm)        LLQ(cm)
 3.5           2
Biophysical Evaluation
 Amniotic F.V:   Pocket => 2 cm             F. Tone:        Observed
 F. Movement:    Observed                   N.S.T:          Reactive
 F. Breathing:   Observed                   Score:          [DATE]
OB History

 Gravidity:    5         Term:   2         SAB:   2
Gestational Age

 Best:          34w 4d     Det. By:  Previous Ultrasound      EDD:   05/08/20
                                     (09/18/19)
Impression

 Antenatal testing is reassuring with BPP [DATE]. Normal
 amniotic fluid volume
Recommendations

 Continue weekly antenatal testing till delivery .
              Mauro, Danyale

## 2021-08-26 IMAGING — US US MFM OB FOLLOW-UP
1 series · 14 of 28 positions shown · non-contrast
Comparison: none

[Series 1: us mfm ob follow-up · 58 acquisitions, 14 frames shown]
[im 3/58]
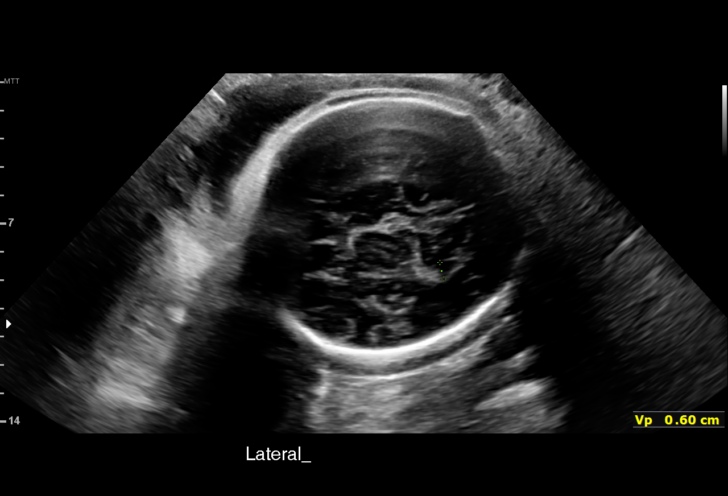
[im 7/58]
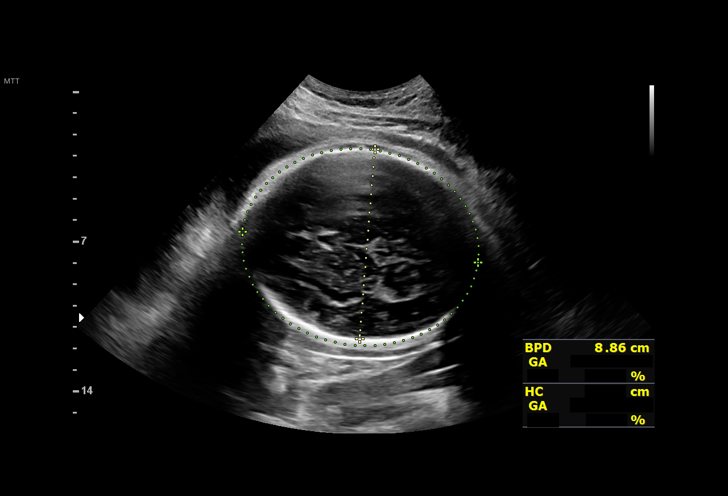
[im 11/58]
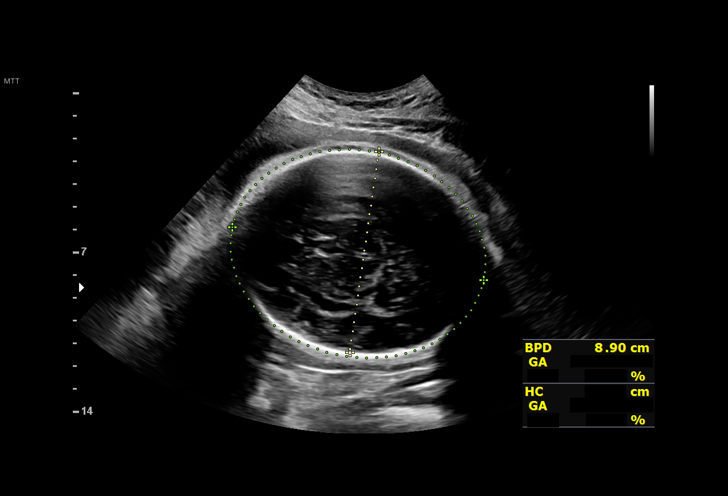
[im 15/58]
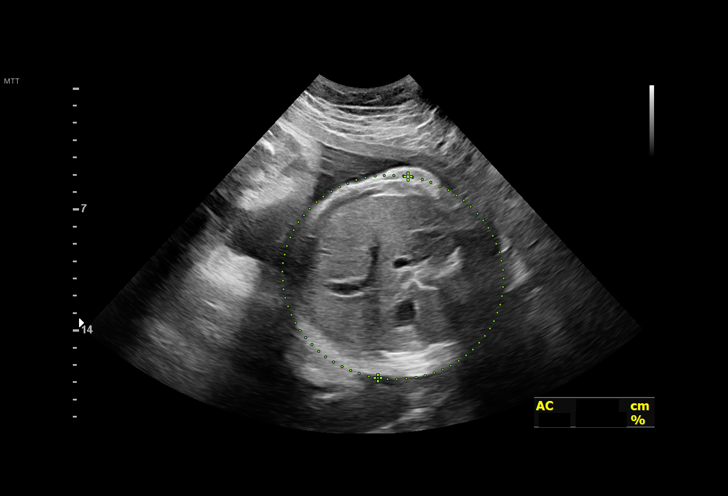
[im 20/58]
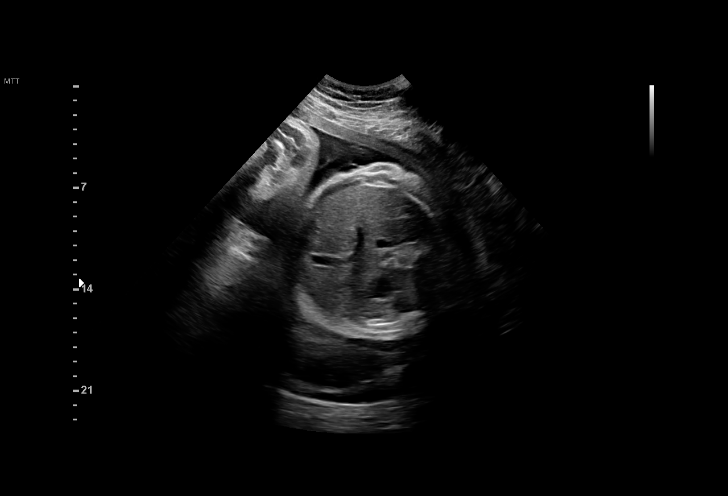
[im 24/58]
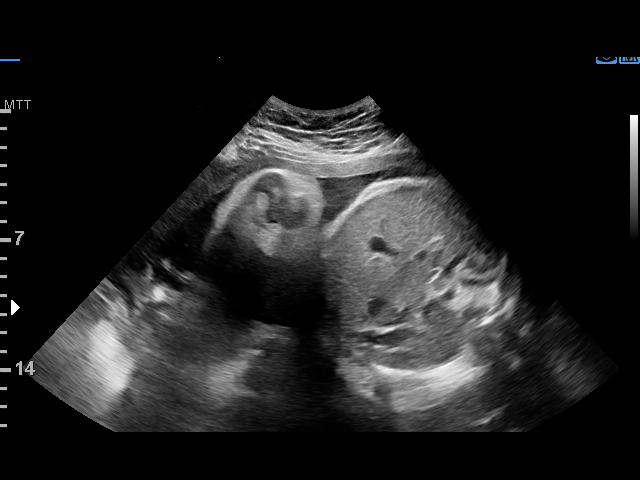
[im 28/58]
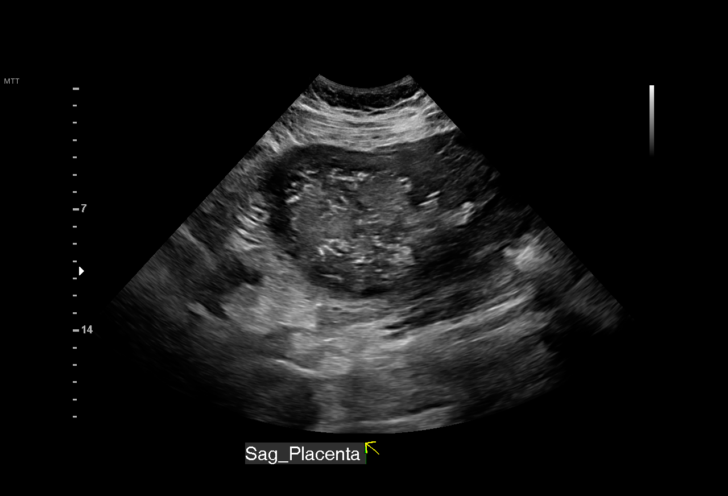
[im 32/58]
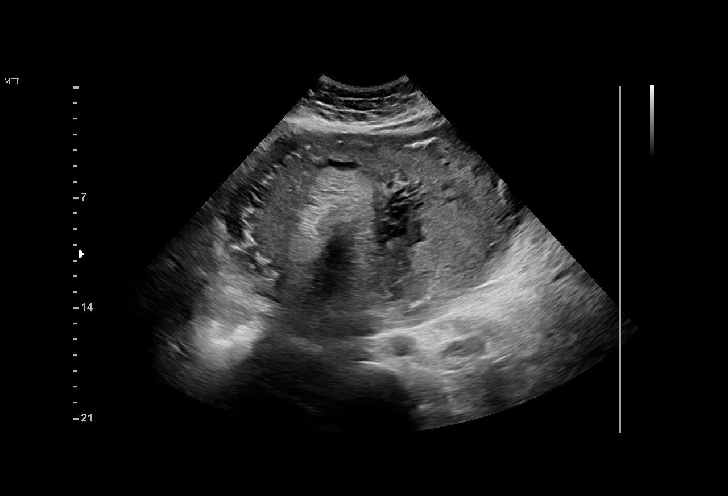
[im 36/58]
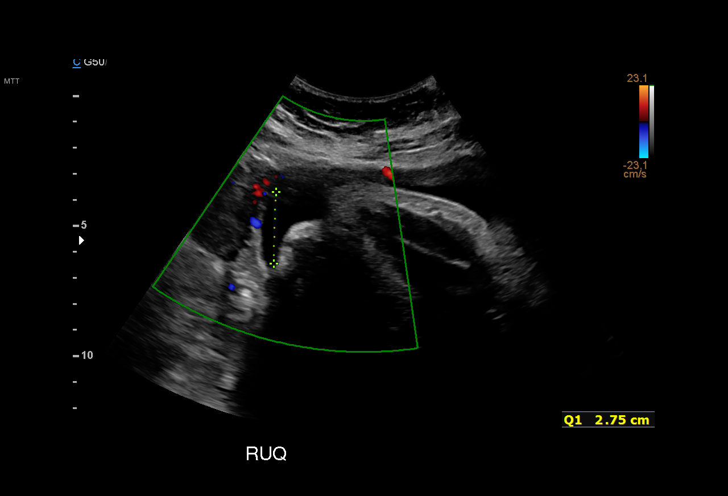
[im 41/58]
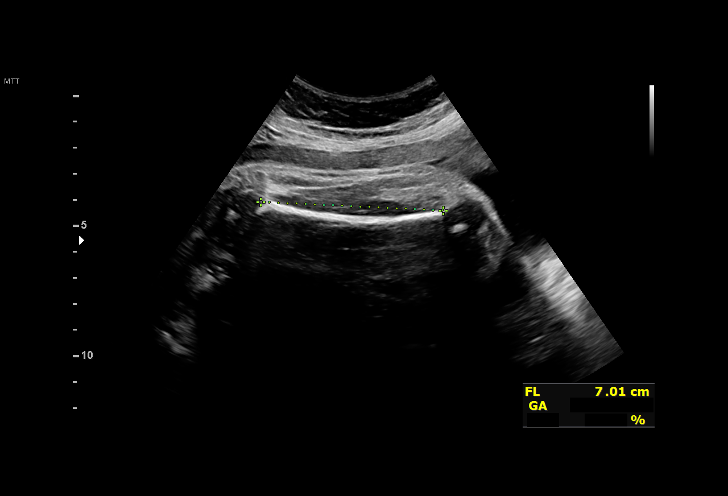
[im 45/58]
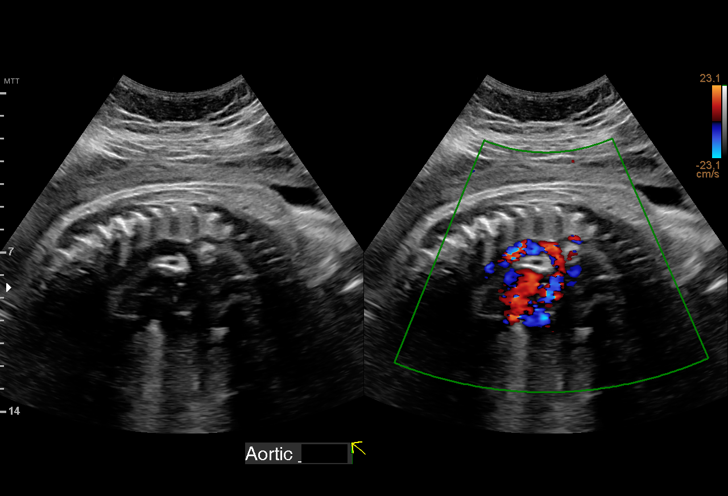
[im 49/58]
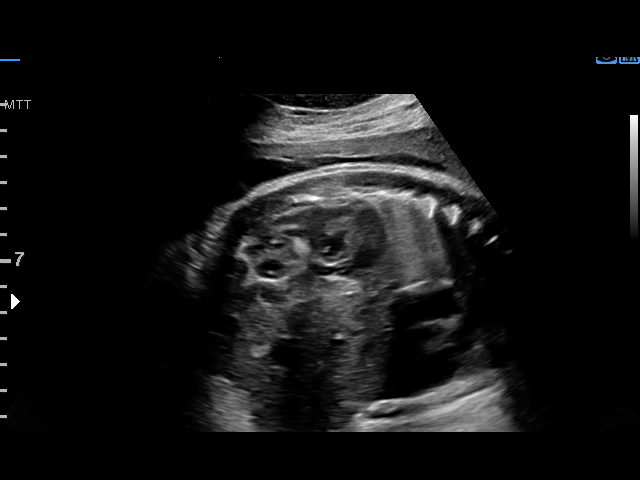
[im 53/58]
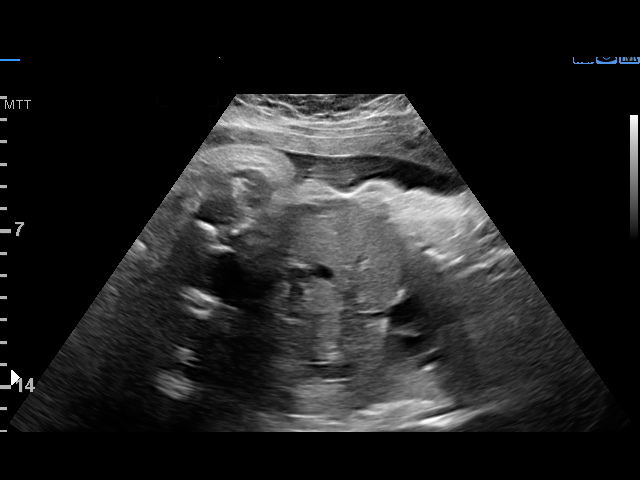
[im 58/58]
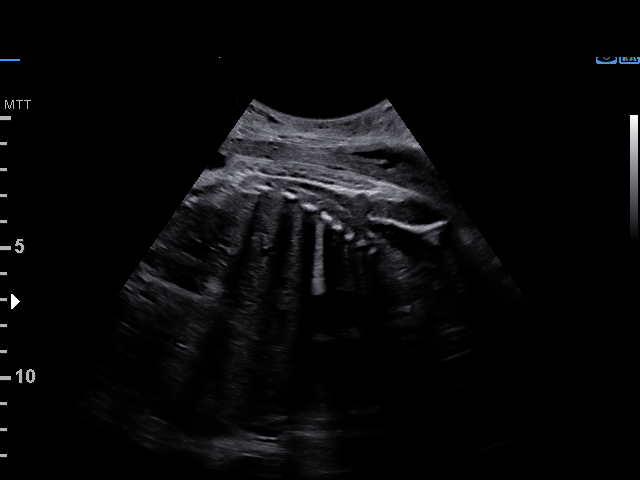

[14 of 28 positions shown; findings below may reference images not displayed]

Indications

 Gestational diabetes in pregnancy,
 controlled by oral hypoglycemic drugs
 36 weeks gestation of pregnancy
 Advanced maternal age multigravida 35+,
 third trimester
 Poor obstetric history: Previous gestational
 diabetes
 Poor obstetric history: Previous
 preeclampsia / eclampsia/gestational HTN
 Encounter for other antenatal screening
 follow-up
Fetal Evaluation

 Num Of Fetuses:         1
 Fetal Heart Rate(bpm):  143
 Cardiac Activity:       Observed
 Presentation:           Cephalic
 Placenta:               Posterior Fundal
 P. Cord Insertion:      Previously Visualized

 Amniotic Fluid
 AFI FV:      Within normal limits

 AFI Sum(cm)     %Tile       Largest Pocket(cm)
 16.2            61
 RUQ(cm)       RLQ(cm)       LUQ(cm)        LLQ(cm)

Biophysical Evaluation

 Amniotic F.V:   Pocket => 2 cm             F. Tone:        Observed
 F. Movement:    Observed                   Score:          [DATE]
 F. Breathing:   Observed
Biometry

 BPD:      88.5  mm     G. Age:  35w 5d         39  %    CI:        77.38   %    70 - 86
                                                         FL/HC:      22.3   %    20.8 -
 HC:      318.5  mm     G. Age:  35w 6d         10  %    HC/AC:      0.84        0.92 -
 AC:      379.9  mm     G. Age:  42w 0d       > 99  %    FL/BPD:     80.2   %    71 - 87
 FL:         71  mm     G. Age:  36w 3d         42  %    FL/AC:      18.7   %    20 - 24

 LV:          6  mm

 Est. FW:    8363  gm      8 lb 4 oz     99  %
OB History

 Gravidity:    5         Term:   2         SAB:   2
Gestational Age

 U/S Today:     37w 4d                                        EDD:   05/01/20
 Best:          36w 4d     Det. By:  Previous Ultrasound      EDD:   05/08/20
                                     (09/18/19)
Anatomy

 Cranium:               Previously seen        Aortic Arch:            Appears normal
 Cavum:                 Previously seen        Ductal Arch:            Previously seen
 Ventricles:            Appears normal         Diaphragm:              Appears normal
 Choroid Plexus:        Previously seen        Stomach:                Appears normal, left
                                                                       sided
 Cerebellum:            Previously seen        Abdomen:                Previously seen
 Posterior Fossa:       Previously seen        Abdominal Wall:         Previously seen
 Nuchal Fold:           Previously seen        Cord Vessels:           Previously seen
 Face:                  Orbits and profile     Kidneys:                Appear normal
                        previously seen
 Lips:                  Previously seen        Bladder:                Appears normal
 Thoracic:              Previously seen        Spine:                  Previously seen
 Heart:                 Previously seen        Upper Extremities:      Previously seen
 RVOT:                  Previously seen        Lower Extremities:      Previously seen
 LVOT:                  Previously seen

 Other:  Male gender previously seen. Heels, 5th digit, Nasal bone, Feet
         previously visualized. Technicallly difficult due to advanced GA,
         maternal habitus, and fetal position.
Cervix Uterus Adnexa

 Cervix
 Not visualized (advanced GA >27wks)
 Uterus
 No abnormality visualized.

 Right Ovary
 Within normal limits.

 Left Ovary
 Within normal limits.

 Adnexa
 No abnormality visualized.
Comments

 This patient was seen for a follow up growth scan due to
 gestational diabetes that is currently treated with Metformin
 8555 mg twice a day.  I reviewed the patient's fingerstick
 values today.  The majority of her fasting values and her 2-
 hour postprandial dinner values are all elevated.  The patient
 reports 2 prior vaginal deliveries of babies weighing about 6-
 [DATE] pounds.
 She was informed that the fetal growth measures large for
 her gestational [AGE]th percentile).  There was normal
 amniotic fluid noted today.
 A biophysical profile performed today was [DATE].
 Due to her uncontrolled diabetes and a larger sized fetus, I
 would recommend delivery at between 37 to 38 weeks.
 The patient should continue fetal testing until delivery.

## 2022-05-19 ENCOUNTER — Ambulatory Visit: Payer: Medicaid Other | Admitting: Family Medicine

## 2022-05-25 ENCOUNTER — Ambulatory Visit: Payer: Medicaid Other | Admitting: Family Medicine

## 2022-05-25 ENCOUNTER — Encounter: Payer: Self-pay | Admitting: Family Medicine

## 2022-05-25 VITALS — BP 138/106 | HR 96 | Temp 98.2°F | Resp 16 | Ht 68.0 in | Wt 222.6 lb

## 2022-05-25 DIAGNOSIS — R21 Rash and other nonspecific skin eruption: Secondary | ICD-10-CM

## 2022-05-25 MED ORDER — TACROLIMUS 0.03 % EX OINT: TOPICAL_OINTMENT | Freq: Two times a day (BID) | CUTANEOUS | 0 refills | Status: AC

## 2022-05-25 MED ORDER — TRIAMCINOLONE ACETONIDE 0.1 % EX OINT: 1.0000 | TOPICAL_OINTMENT | Freq: Two times a day (BID) | CUTANEOUS | 0 refills | Status: AC

## 2022-05-25 NOTE — Progress Notes (Unsigned)
   Established Patient Office Visit  Subjective   Patient ID: Wendy Harris, female    DOB: 1981-06-11  Age: 41 y.o. MRN: [id number]  No chief complaint on file.   HPI  {History (Optional):23778}  ROS    Objective:     BP (!) 138/106 (BP Location: Left Arm, Patient Position: Sitting)   Pulse 96   Temp 98.2 F (36.8 C) (Temporal)   Resp 16   Ht 5\' 8"  (1.727 m)   Wt 222 lb 9.6 oz (101 kg)   SpO2 98%   BMI 33.85 kg/m  {Vitals History (Optional):23777}  Physical Exam   No results found for any visits on 05/25/22.  {Labs (Optional):23779}  The ASCVD Risk score (Arnett DK, et al., 2019) failed to calculate for the following reasons:   Cannot find a previous HDL lab   Cannot find a previous total cholesterol lab   Unable to determine if patient is Non-Hispanic African American    Assessment & Plan:   Problem List Items Addressed This Visit   None   No follow-ups on file.    2020, MD

## 2022-05-26 LAB — COMPREHENSIVE METABOLIC PANEL
ALT: 13 IU/L (ref 0–32)
AST: 13 IU/L (ref 0–40)
Albumin/Globulin Ratio: 1.5 (ref 1.2–2.2)
Albumin: 4.5 g/dL (ref 3.9–4.9)
Alkaline Phosphatase: 77 IU/L (ref 44–121)
BUN/Creatinine Ratio: 14 (ref 9–23)
BUN: 11 mg/dL (ref 6–24)
Bilirubin Total: 0.5 mg/dL (ref 0.0–1.2)
CO2: 23 mmol/L (ref 20–29)
Calcium: 9.8 mg/dL (ref 8.7–10.2)
Chloride: 100 mmol/L (ref 96–106)
Creatinine, Ser: 0.78 mg/dL (ref 0.57–1.00)
Globulin, Total: 3 g/dL (ref 1.5–4.5)
Glucose: 87 mg/dL (ref 70–99)
Potassium: 4.3 mmol/L (ref 3.5–5.2)
Sodium: 137 mmol/L (ref 134–144)
Total Protein: 7.5 g/dL (ref 6.0–8.5)
eGFR: 98 mL/min/{1.73_m2} (ref >59)

## 2022-05-26 LAB — CBC WITH DIFFERENTIAL/PLATELET
Basophils Absolute: 0.1 10*3/uL (ref 0.0–0.2)
Basos: 1 %
EOS (ABSOLUTE): 0.2 10*3/uL (ref 0.0–0.4)
Eos: 4 %
Hematocrit: 37.9 % (ref 34.0–46.6)
Hemoglobin: 12.6 g/dL (ref 11.1–15.9)
Immature Grans (Abs): 0 10*3/uL (ref 0.0–0.1)
Immature Granulocytes: 0 %
Lymphocytes Absolute: 1.9 10*3/uL (ref 0.7–3.1)
Lymphs: 34 %
MCH: 31.7 pg (ref 26.6–33.0)
MCHC: 33.2 g/dL (ref 31.5–35.7)
MCV: 95 fL (ref 79–97)
Monocytes Absolute: 0.5 10*3/uL (ref 0.1–0.9)
Monocytes: 8 %
Neutrophils Absolute: 3 10*3/uL (ref 1.4–7.0)
Neutrophils: 53 %
Platelets: 477 10*3/uL — ABNORMAL HIGH (ref 150–450)
RBC: 3.98 x10E6/uL (ref 3.77–5.28)
RDW: 12.9 % (ref 11.7–15.4)
WBC: 5.6 10*3/uL (ref 3.4–10.8)

## 2022-05-26 LAB — ANA W/REFLEX IF POSITIVE: Anti Nuclear Antibody (ANA): NEGATIVE

## 2022-06-07 ENCOUNTER — Ambulatory Visit: Payer: Medicaid Other | Admitting: Family Medicine

## 2022-11-17 ENCOUNTER — Encounter: Payer: Self-pay | Admitting: Family Medicine

## 2022-11-17 ENCOUNTER — Ambulatory Visit: Payer: Medicaid Other | Admitting: Family Medicine

## 2022-11-17 VITALS — BP 157/111 | HR 101 | Temp 97.8°F | Ht 68.0 in | Wt 237.0 lb

## 2022-11-17 DIAGNOSIS — F418 Other specified anxiety disorders: Secondary | ICD-10-CM

## 2022-11-17 DIAGNOSIS — R5383 Other fatigue: Secondary | ICD-10-CM

## 2022-11-17 DIAGNOSIS — L209 Atopic dermatitis, unspecified: Secondary | ICD-10-CM

## 2022-11-17 MED ORDER — KETOCONAZOLE 2 % EX SHAM: 1.0000 | MEDICATED_SHAMPOO | CUTANEOUS | 0 refills | Status: AC

## 2022-11-17 MED ORDER — DUPIXENT 300 MG/2ML ~~LOC~~ SOSY: 300.0000 mg | PREFILLED_SYRINGE | Freq: Once | SUBCUTANEOUS | 0 refills | Status: AC

## 2022-11-17 MED ORDER — CYANOCOBALAMIN 1000 MCG/ML IJ SOLN
1000.0000 ug | Freq: Once | INTRAMUSCULAR | Status: AC
Start: 2022-11-17 — End: ?

## 2022-11-17 MED ORDER — PIMECROLIMUS 1 % EX CREA: TOPICAL_CREAM | Freq: Two times a day (BID) | CUTANEOUS | 0 refills | Status: AC
